# Patient Record
Sex: Male | Born: 1935 | Race: Black or African American | Hispanic: No | Marital: Married | Smoking: Former smoker
Health system: Southern US, Community
[De-identification: ages and names within clinical notes are randomized; demographics above are authoritative.]

## PROBLEM LIST (undated history)

## (undated) DIAGNOSIS — E079 Disorder of thyroid, unspecified: Secondary | ICD-10-CM

## (undated) DIAGNOSIS — E785 Hyperlipidemia, unspecified: Secondary | ICD-10-CM

## (undated) DIAGNOSIS — I2510 Atherosclerotic heart disease of native coronary artery without angina pectoris: Secondary | ICD-10-CM

## (undated) DIAGNOSIS — N186 End stage renal disease: Secondary | ICD-10-CM

## (undated) DIAGNOSIS — H5440 Blindness, one eye, unspecified eye: Secondary | ICD-10-CM

## (undated) DIAGNOSIS — I509 Heart failure, unspecified: Secondary | ICD-10-CM

## (undated) DIAGNOSIS — E1121 Type 2 diabetes mellitus with diabetic nephropathy: Secondary | ICD-10-CM

## (undated) DIAGNOSIS — I219 Acute myocardial infarction, unspecified: Secondary | ICD-10-CM

## (undated) DIAGNOSIS — I10 Essential (primary) hypertension: Secondary | ICD-10-CM

## (undated) HISTORY — DX: Blindness, one eye, unspecified eye: H54.40

## (undated) HISTORY — DX: End stage renal disease: N18.6

## (undated) HISTORY — PX: PACEMAKER INSERTION: SHX728

## (undated) HISTORY — DX: Heart failure, unspecified: I50.9

## (undated) HISTORY — DX: Essential (primary) hypertension: I10

## (undated) HISTORY — DX: Acute myocardial infarction, unspecified: I21.9

## (undated) HISTORY — PX: CARDIAC CATHETERIZATION: SHX172

## (undated) HISTORY — PX: PORTACATH PLACEMENT: SHX2246

## (undated) HISTORY — DX: Type 2 diabetes mellitus with diabetic nephropathy: E11.21

## (undated) HISTORY — DX: Atherosclerotic heart disease of native coronary artery without angina pectoris: I25.10

## (undated) HISTORY — DX: Disorder of thyroid, unspecified: E07.9

## (undated) HISTORY — DX: Hyperlipidemia, unspecified: E78.5

---

## 2007-08-30 ENCOUNTER — Ambulatory Visit: Payer: Self-pay | Admitting: Nephrology

## 2012-08-06 ENCOUNTER — Ambulatory Visit: Payer: Self-pay | Admitting: Vascular Surgery

## 2012-08-06 LAB — POTASSIUM: Potassium: 5 mmol/L (ref 3.5–5.1)

## 2012-08-20 ENCOUNTER — Ambulatory Visit: Payer: Self-pay | Admitting: Vascular Surgery

## 2012-08-20 LAB — POTASSIUM: Potassium: 5.3 mmol/L — ABNORMAL HIGH (ref 3.5–5.1)

## 2012-08-20 LAB — GLUCOSE, RANDOM: Glucose: 75 mg/dL (ref 65–99)

## 2012-11-15 ENCOUNTER — Ambulatory Visit: Payer: Self-pay | Admitting: Vascular Surgery

## 2013-01-07 ENCOUNTER — Encounter: Payer: Self-pay | Admitting: Cardiovascular Disease

## 2013-01-07 ENCOUNTER — Ambulatory Visit (INDEPENDENT_AMBULATORY_CARE_PROVIDER_SITE_OTHER): Payer: Medicare Other | Admitting: Cardiovascular Disease

## 2013-01-07 VITALS — BP 142/82 | HR 67 | Ht 69.0 in | Wt 252.2 lb

## 2013-01-07 DIAGNOSIS — R079 Chest pain, unspecified: Secondary | ICD-10-CM

## 2013-01-07 DIAGNOSIS — Z01810 Encounter for preprocedural cardiovascular examination: Secondary | ICD-10-CM

## 2013-01-07 NOTE — Patient Instructions (Addendum)
Your physician has requested that you have a lexiscan myoview. For further information please visit [email or web address]. Please follow instruction sheet, as given.  Follow up as needed 

## 2013-01-07 NOTE — Progress Notes (Signed)
HPI  This is a 78 year old African American male who was referred by Dr. Wynelle Link for preoperative cardiovascular evaluation for possibly AV fistula under general anesthesia. The patient has extensive medical problems. He has end-stage renal disease on hemodialysis since 2010 related to prolonged history of diabetes (he reports having diabetes for 50 years). He also has no history of hypertension, hyperlipidemia and diabetic retinopathy causing blindness. He reports having myocardial infarction years ago in [place] but the details are not available. He reports having a heart catheterization done more than 3 years ago. He is not aware of any previous revascularization. There has been no recent cardiac evaluation. He is mostly wheelchair bound but can walk slowly with a walker. She reports no chest pain but does have exertional dyspnea. His functional capacity is poor.  No Known Allergies   No current outpatient prescriptions on file prior to visit.   No current facility-administered medications on file prior to visit.     Past Medical History  Diagnosis Date  . Hypertension   . Hyperlipidemia   . Renal failure   . MI (myocardial infarction)   . Thyroid disease     hypo  . 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 Diabetes mellitus without complication      Past Surgical History  Procedure Laterality Date  . Cardiac catheterization      [facility]  . Portacath placement       Family History  Problem Relation Age of Onset  . Hypertension Mother   . Hyperlipidemia Mother   . Heart disease Mother   . Heart attack Mother      History   Social History  . Marital Status: Married    Spouse Name: N/A    Number of Children: N/A  . Years of Education: N/A   Occupational History  . Not on file.   Social History Main Topics  . Smoking status: Former Smoker -- 1.00 packs/day for 25 years  . Smokeless tobacco: Not on file  . Alcohol Use: No  . Drug Use: No  . Sexually Active: Not on file     Other Topics Concern  . Not on file   Social History Narrative  . No narrative on file     ROS Constitutional: Negative for fever, chills, diaphoresis, activity change, appetite change and fatigue.  HENT: Negative for hearing loss, nosebleeds, congestion, sore throat, facial swelling, drooling, trouble swallowing, neck pain, voice change, sinus pressure and tinnitus.  Eyes: Negative for photophobia, pain, discharge and visual disturbance.  Respiratory: Negative for apnea, cough,  shortness of breath and wheezing.  Cardiovascular: Negative for chest pain, palpitations. Gastrointestinal: Negative for nausea, vomiting, abdominal pain, diarrhea, constipation, blood in stool and abdominal distention.  Genitourinary: Negative for dysuria. Skin: Negative for color change, pallor, rash and wound.  Neurological: Negative for dizziness, tremors, seizures, syncope, speech difficulty, weakness, light-headedness, numbness and headaches.  Psychiatric/Behavioral: Negative for suicidal ideas, hallucinations, behavioral problems and agitation. The patient is not nervous/anxious.     PHYSICAL EXAM   BP 142/82  Pulse 67  Ht 5\' 9"  (1.753 m)  Wt 252 lb 4 oz (114.42 kg)  BMI 37.23 kg/m2 Constitutional: He is oriented to person, place, and time. He appears well-developed and well-nourished. No distress.  HENT: No nasal discharge.  Head: Normocephalic and atraumatic.  Eyes: Pupils are equal and round. Right eye exhibits no discharge. Left eye exhibits no discharge.  Neck: Normal range of motion. Neck supple. No JVD present. No  thyromegaly present.  Cardiovascular: Normal rate, regular rhythm, normal heart sounds and. Exam reveals no gallop and no friction rub. No murmur heard.  Pulmonary/Chest: Effort normal and breath sounds normal. No stridor. No respiratory distress. He has no wheezes. He has no rales. He exhibits no tenderness.  Abdominal: Soft. Bowel sounds are normal. He exhibits no  distension. There is no tenderness. There is no rebound and no guarding.  Musculoskeletal: Normal range of motion. He exhibits no edema and no tenderness.  Neurological: He is alert and oriented to person, place, and time. Coordination normal.  Skin: Skin is warm and dry. No rash noted. He is not diaphoretic. No erythema. No pallor.  Psychiatric: He has a normal mood and affect. His behavior is normal. Judgment and thought content normal.       NWG:NFAOZ  Rhythm  Low voltage in precordial leads.   -Possible Old inferior infarct  -Poor R-wave progression -may be secondary to pulmonary disease   consider old anterior infarct.   ABNORMAL    ASSESSMENT AND PLAN

## 2013-01-07 NOTE — Assessment & Plan Note (Signed)
Mr. Runnion has extensive medical problems and multiple risk factors for coronary artery disease including prolonged history of diabetes. His functional capacity is poor (less than 4 METs). He reports exertional dyspnea without chest pain. His baseline ECG is abnormal with possible old inferior and anterior infarct with no recent cardiac evaluation. Due to that, I recommend a pharmacologic nuclear stress test for evaluation. He is not able to exercise on a treadmill as he is mostly wheelchair bound. Further recommendations to follow after his stress test.

## 2013-01-09 ENCOUNTER — Ambulatory Visit: Payer: Self-pay | Admitting: Cardiovascular Disease

## 2013-01-09 DIAGNOSIS — R079 Chest pain, unspecified: Secondary | ICD-10-CM

## 2013-01-13 ENCOUNTER — Telehealth: Payer: Self-pay

## 2013-01-13 NOTE — Telephone Encounter (Signed)
Any suggestions>?

## 2013-01-13 NOTE — Telephone Encounter (Signed)
Pt son, daryl called back to schedule cath.

## 2013-01-13 NOTE — Telephone Encounter (Signed)
Pt's son, daryl, ready to schedule LHC for pt. Needs Tues/Thurs since he has dialysis on M,W,F I will have to talk to Dr. Kirke Corin about best day to do this and call him back Understanding verb

## 2013-01-13 NOTE — Telephone Encounter (Signed)
Results given to pt's wife who asks that I discuss with her son who manages their schedules, etc Pt is at dialysis at this time Will await son to call me back

## 2013-01-13 NOTE — Telephone Encounter (Signed)
See below

## 2013-01-13 NOTE — Telephone Encounter (Signed)
Message copied by [facility], Nerea Bordenave E on Mon Jan 13, 2013 11:03 AM ------      Message from: Lorine Bears A      Created: Mon Jan 13, 2013 10:19 AM      Regarding: RE: stress test       Inform him that stress test was abnormal. I recommend scheduling a cardiac catheterization.             ----- Message -----         From: Marcelle Overlie, RN         Sent: 01/13/2013   8:23 AM           To: Iran Ouch, MD      Subject: stress test                                              FYI: Per Dr. Mariah Milling stress test was positive      Just let me know what I need to do and I will contact pt      Thanks!!       ------

## 2013-01-14 NOTE — Telephone Encounter (Signed)
Next week on Thursday at noon

## 2013-01-15 NOTE — Telephone Encounter (Signed)
He needs CBC, BMP and PT/INR.

## 2013-01-15 NOTE — Telephone Encounter (Signed)
Attempted to schedule cath x 3 at [facility] going to [facility] Will keep trying

## 2013-01-15 NOTE — Telephone Encounter (Signed)
Son informed of instructions re:LHC 5/1 Understanding verb

## 2013-01-15 NOTE — Telephone Encounter (Signed)
Will you please review labs from dialysis and let me know if you think he needs more labs and I can have them get these at next dialysis appt  Does he need pre cath renal protocol orders?  

## 2013-01-15 NOTE — Telephone Encounter (Signed)
Will you please review labs from dialysis and let me know if you think he needs more labs and I can have them get these at next dialysis appt  Does he need pre cath renal protocol orders?

## 2013-01-15 NOTE — Telephone Encounter (Signed)
Schedule LHC at [facility] 01/23/13

## 2013-01-16 ENCOUNTER — Other Ambulatory Visit: Payer: Self-pay

## 2013-01-16 DIAGNOSIS — I2510 Atherosclerotic heart disease of native coronary artery without angina pectoris: Secondary | ICD-10-CM

## 2013-01-16 NOTE — Telephone Encounter (Signed)
They gave me local # [phone]

## 2013-01-16 NOTE — Telephone Encounter (Signed)
Calling [facility] in [place] at [phone] to have them draw labs at next dialysis appt

## 2013-01-16 NOTE — Telephone Encounter (Signed)
Per Dialysis nurse, they only draw labs monthly and have already checked for this month We will bring him in for labs  Wife informed They will bring him 4/29 for labs

## 2013-01-21 ENCOUNTER — Ambulatory Visit (INDEPENDENT_AMBULATORY_CARE_PROVIDER_SITE_OTHER): Payer: Medicare Other

## 2013-01-21 DIAGNOSIS — I2510 Atherosclerotic heart disease of native coronary artery without angina pectoris: Secondary | ICD-10-CM

## 2013-01-23 ENCOUNTER — Ambulatory Visit: Payer: Self-pay | Admitting: Cardiovascular Disease

## 2013-01-23 ENCOUNTER — Encounter: Payer: Self-pay | Admitting: Cardiovascular Disease

## 2013-01-23 DIAGNOSIS — I2510 Atherosclerotic heart disease of native coronary artery without angina pectoris: Secondary | ICD-10-CM

## 2013-01-23 HISTORY — PX: CARDIAC CATHETERIZATION: SHX172

## 2013-01-27 ENCOUNTER — Encounter: Payer: Self-pay | Admitting: *Deleted

## 2013-01-28 LAB — CBC WITH DIFFERENTIAL
Basos: 0 % (ref 0–3)
Eos: 0 % (ref 0–5)
HCT: 32.2 % — ABNORMAL LOW (ref 37.5–51.0)
Hemoglobin: 10.9 g/dL — ABNORMAL LOW (ref 12.6–17.7)
Immature Granulocytes: 0 % (ref 0–2)
Lymphocytes Absolute: 1.5 10*3/uL (ref 0.7–3.1)
MCH: 30.7 pg (ref 26.6–33.0)
MCV: 91 fL (ref 79–97)
Monocytes Absolute: 0.4 10*3/uL (ref 0.1–0.9)
Monocytes: 8 % (ref 4–12)
RBC: 3.55 x10E6/uL — ABNORMAL LOW (ref 4.14–5.80)
WBC: 4.4 10*3/uL (ref 3.4–10.8)

## 2013-01-28 LAB — BASIC METABOLIC PANEL
BUN/Creatinine Ratio: 4 — ABNORMAL LOW (ref 10–22)
BUN: 36 mg/dL — ABNORMAL HIGH (ref 8–27)
CO2: 20 mmol/L (ref 19–28)
Chloride: 96 mmol/L — ABNORMAL LOW (ref 97–108)
Glucose: 96 mg/dL (ref 65–99)

## 2013-01-28 LAB — PROTIME-INR
INR: 1.1 (ref 0.8–1.2)
Prothrombin Time: 11.2 s (ref 9.1–12.0)

## 2013-01-30 ENCOUNTER — Telehealth: Payer: Self-pay

## 2013-01-30 NOTE — Telephone Encounter (Signed)
You can go ahead and send the clearance (moderate risk). Fax them the cath report as well.

## 2013-01-30 NOTE — Telephone Encounter (Signed)
Tanisha from [facility] called regarding pt cardica clearance. Pt needs surgery pending outcome of stress test. Pleas advise Fax [phone]/ phone [phone]

## 2013-01-30 NOTE — Telephone Encounter (Signed)
Will forward to Dr. Kirke Corin. Cath report pulled from 01/23/13 stating 2 vessel CAD with planned medical therapy.  Moderate risk for planned surgery. I didn't know if you wanted me to go ahead and send clearance for surgery or did you want to to see him for post cath follow up prior to me sending this. Appointment with you is on 02/14/13.

## 2013-01-31 NOTE — Telephone Encounter (Signed)
faxing

## 2013-02-11 ENCOUNTER — Encounter: Payer: Self-pay | Admitting: Physician Assistant

## 2013-02-11 ENCOUNTER — Ambulatory Visit (INDEPENDENT_AMBULATORY_CARE_PROVIDER_SITE_OTHER): Payer: Medicare Other | Admitting: Physician Assistant

## 2013-02-11 VITALS — BP 165/83 | HR 68 | Ht 69.0 in | Wt 255.8 lb

## 2013-02-11 DIAGNOSIS — I2510 Atherosclerotic heart disease of native coronary artery without angina pectoris: Secondary | ICD-10-CM | POA: Insufficient documentation

## 2013-02-11 DIAGNOSIS — I10 Essential (primary) hypertension: Secondary | ICD-10-CM | POA: Insufficient documentation

## 2013-02-11 DIAGNOSIS — R42 Dizziness and giddiness: Secondary | ICD-10-CM

## 2013-02-11 DIAGNOSIS — E785 Hyperlipidemia, unspecified: Secondary | ICD-10-CM

## 2013-02-11 DIAGNOSIS — Z01810 Encounter for preprocedural cardiovascular examination: Secondary | ICD-10-CM

## 2013-02-11 MED ORDER — NITROGLYCERIN 0.4 MG SL SUBL: 0.4000 mg | SUBLINGUAL_TABLET | SUBLINGUAL | Status: AC | PRN

## 2013-02-11 MED ORDER — ATORVASTATIN CALCIUM 80 MG PO TABS: 80.0000 mg | ORAL_TABLET | Freq: Every day | ORAL | Status: DC

## 2013-02-11 MED ORDER — CARVEDILOL 6.25 MG PO TABS: 6.2500 mg | ORAL_TABLET | Freq: Two times a day (BID) | ORAL | Status: DC

## 2013-02-11 NOTE — Assessment & Plan Note (Addendum)
The patient has diffuse diabetic CAD evidence of cardiac cath earlier this month. No targets for PCI. This is clinically stable. He denies any intercurrent progressive or new ischemic symptoms. No new ischemic signs on EKG today. Will plan to aggressively manage. Continue ASA/Plavix, ACEi, add carvedilol 6.25mg  PO BID (hallucinated on metoprolol in the past), replace lovastatin with atorvastatin 80 and add NTG SL PRN.

## 2013-02-11 NOTE — Patient Instructions (Addendum)
We will add carvedilol 6.25 mg by mouth twice a day. Please monitor your blood pressure with this medication.  We will place lovastatin with atorvastatin. Please take as prescribed. We will need to check labwork for your liver and cholesterol in 6 weeks with this medication.  Please follow-up with vascular/vein specialists as scheduled.   We will add nitroglycerin. Please take as needed for chest pain. If you have chest pain, place one tablet under your tongue and let it dissolve. If the pain continues after 5 minutes, take a second tablet and wait 5 minutes, if the pain persists take a third tablet and wait 5 minutes. If the pain continues call 911.   We will plan to see you in 3 months.

## 2013-02-11 NOTE — Assessment & Plan Note (Signed)
Lovastatin replaced with high-dose atorvastatin. Check LFTs and lipid panel in 6 weeks.

## 2013-02-11 NOTE — Assessment & Plan Note (Signed)
Moderate pre-op risk deemed by Dr. Deborha Payment. Will be available if recommendations for pre-op holding of ASA/Plavix requested.

## 2013-02-11 NOTE — Progress Notes (Signed)
Patient ID: [name], male   DOB: October 07, 1934, 78 y.o.   MRN: [id number]            Date:  02/11/2013   ID:  [name], DOB 1935-03-15, MRN [id number]  PCP:  No PCP Per Patient  Primary Cardiologist:  Judie Petit. Kirke Corin, MD  History of Present Illness: [name] is a 78 y.o. male with longstanding type 2 DM with diabetic nephrology, ESRD on HD, HTN, HLD, obesity and h/o prior MI (in [place]) who was referred for pre-op evaluation for upper extremity AV fistula 01/07/13.   He denied any new or progressive ischemic symptoms, however given his significant cardiac risk factors and history of prior MI, the decision was made to proceed with further ischemic evalutation in the form of diagnostic cardiac catheterization. This was performed on 01/23/13.   This revealed diffuse, heavily calcified CAD in a diabetic pattern without optimal targets for PCI- mild left main calcification, 30% oLAD, 40% pLAD, 50% mLAD, 30% dLAD, 40% mLCx, 90% OM1, 50% OM1, 50% pRI, 50% pRCA,50% mRCA, 99% dRCA stenoses; LVEF 50%. He tolerated the procedure well without complications. He was deemed moderate pre-operative risk. He presents today for follow-up.   He has felt well since cath. Groin site healed without residual tenderness, swelling or discharge. Denies chest pain, shortness of breath, dyspnea on exertion, PND, orthopnea or LE edema. He plans to follow-up with a vascular/vein specialist this Friday. Timing for AV fistula will be determined then.   EKG: NSR, 68 bpm, TWI/flattening III, aVF, no ST changes  Wt Readings from Last 3 Encounters:  02/11/13 255 lb 12 oz (116.007 kg)  01/07/13 252 lb 4 oz (114.42 kg)     Past Medical History  Diagnosis Date  . Hypertension   . Hyperlipidemia   . Renal failure   . MI (myocardial infarction)   . Thyroid disease     hypo  . 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 Diabetes mellitus without complication     Current Outpatient Prescriptions  Medication Sig Dispense Refill  . aspirin  81 MG tablet Take 81 mg by mouth daily.      . B Complex-C-Folic Acid (DIALYVITE 800 PO) Take by mouth daily.      . cinacalcet (SENSIPAR) 60 MG tablet Take 60 mg by mouth daily.      . clopidogrel (PLAVIX) 75 MG tablet Take 75 mg by mouth daily.      . hydrALAZINE (APRESOLINE) 50 MG tablet Take 50 mg by mouth 3 (three) times daily.      Marland Kitchen levothyroxine (SYNTHROID, LEVOTHROID) 100 MCG tablet Take 100 mcg by mouth daily before breakfast.      . lisinopril (PRINIVIL,ZESTRIL) 10 MG tablet Take 10 mg by mouth daily.      . sevelamer carbonate (RENVELA) 800 MG tablet Takes 4 tablets three times a day and 1 tablet twice daily depending on dialysis.      Marland Kitchen atorvastatin (LIPITOR) 80 MG tablet Take 1 tablet (80 mg total) by mouth daily.  90 tablet  3  . carvedilol (COREG) 6.25 MG tablet Take 1 tablet (6.25 mg total) by mouth 2 (two) times daily.  60 tablet  3  . nitroGLYCERIN (NITROSTAT) 0.4 MG SL tablet Place 1 tablet (0.4 mg total) under the tongue every 5 (five) minutes as needed for chest pain.  25 tablet  3   No current facility-administered medications for this visit.    Allergies:   No Known Allergies  Social History:  The patient  reports that he has quit smoking. He does not have any smokeless tobacco history on file. He reports that he does not drink alcohol or use illicit drugs.   ROS:  Please see the history of present illness.   All other systems reviewed and negative.   PHYSICAL EXAM: VS:  BP 165/83  Pulse 68  Ht 5\' 9"  (1.753 m)  Wt 255 lb 12 oz (116.007 kg)  BMI 37.75 kg/m2 Obese African-American male in a wheelchair in NAD HEENT: normal, poor dentition Neck: no JVD Cardiac:  normal S1, S2; RRR; no murmur Lungs:  clear to auscultation bilaterally, no wheezing, rhonchi or rales Abd: soft, nontender, no hepatomegaly Ext: no edema, no erythema, tenderness or bruit to R groin site Skin: warm and dry Neuro:  CNs 2-12 intact, no focal abnormalities noted

## 2013-02-11 NOTE — Assessment & Plan Note (Signed)
165/83 in the office today. Will add carvedilol to antihypertensive regimen.

## 2013-02-13 ENCOUNTER — Ambulatory Visit: Payer: Self-pay | Admitting: Vascular Surgery

## 2013-02-13 LAB — BASIC METABOLIC PANEL
Co2: 27 mmol/L (ref 21–32)
EGFR (Non-African Amer.): 6 — ABNORMAL LOW
Osmolality: 284 (ref 275–301)
Sodium: 138 mmol/L (ref 136–145)

## 2013-02-14 ENCOUNTER — Ambulatory Visit: Payer: Medicare Other | Admitting: Cardiovascular Disease

## 2013-02-20 LAB — CBC
HCT: 30.7 % — ABNORMAL LOW (ref 40.0–52.0)
HGB: 10.4 g/dL — ABNORMAL LOW (ref 13.0–18.0)
MCH: 32 pg (ref 26.0–34.0)
MCHC: 34 g/dL (ref 32.0–36.0)
Platelet: 206 10*3/uL (ref 150–440)
WBC: 4.9 10*3/uL (ref 3.8–10.6)

## 2013-02-21 ENCOUNTER — Ambulatory Visit: Payer: Self-pay | Admitting: Vascular Surgery

## 2013-03-26 ENCOUNTER — Telehealth: Payer: Self-pay

## 2013-03-26 NOTE — Telephone Encounter (Signed)
I called pt to remind him he is due for L/L  I spoke with wife who says pt at dialysis now but has appt with [facility] 7/10 Has issues with transportation so I told her I would call [facility] to see if they can check it same day

## 2013-04-30 ENCOUNTER — Ambulatory Visit: Payer: Self-pay | Admitting: Physician Assistant

## 2013-06-18 ENCOUNTER — Emergency Department: Payer: Self-pay | Admitting: Emergency Medicine

## 2013-06-18 LAB — URINALYSIS, COMPLETE
Bacteria: NONE SEEN
Ketone: NEGATIVE
Nitrite: NEGATIVE
Protein: 100
Specific Gravity: 1.012 (ref 1.003–1.030)
Squamous Epithelial: <1
WBC UR: 7 /HPF (ref 0–5)

## 2013-06-18 LAB — COMPREHENSIVE METABOLIC PANEL
Alkaline Phosphatase: 75 U/L (ref 50–136)
BUN: 37 mg/dL — ABNORMAL HIGH (ref 7–18)
Bilirubin,Total: 0.4 mg/dL (ref 0.2–1.0)
Co2: 22 mmol/L (ref 21–32)
Creatinine: 7.18 mg/dL — ABNORMAL HIGH (ref 0.60–1.30)
Glucose: 92 mg/dL (ref 65–99)
Potassium: 4.8 mmol/L (ref 3.5–5.1)
Sodium: 134 mmol/L — ABNORMAL LOW (ref 136–145)
Total Protein: 7.9 g/dL (ref 6.4–8.2)

## 2013-06-18 LAB — PROTIME-INR
INR: 1
Prothrombin Time: 13.8 secs (ref 11.5–14.7)

## 2013-06-18 LAB — CK TOTAL AND CKMB (NOT AT ARMC)
CK, Total: 412 U/L — ABNORMAL HIGH (ref 35–232)
CK-MB: 5.2 ng/mL — ABNORMAL HIGH (ref 0.5–3.6)

## 2013-06-18 LAB — CBC
MCH: 29.9 pg (ref 26.0–34.0)
MCHC: 33.6 g/dL (ref 32.0–36.0)
RBC: 4.13 10*6/uL — ABNORMAL LOW (ref 4.40–5.90)

## 2013-06-18 LAB — APTT: Activated PTT: 28.8 secs (ref 23.6–35.9)

## 2013-06-18 LAB — TROPONIN I
Troponin-I: <0.02 ng/mL
Troponin-I: <0.02 ng/mL

## 2013-09-02 ENCOUNTER — Ambulatory Visit: Payer: Self-pay | Admitting: Vascular Surgery

## 2013-09-02 LAB — POTASSIUM: Potassium: 5.2 mmol/L — ABNORMAL HIGH (ref 3.5–5.1)

## 2013-09-03 ENCOUNTER — Inpatient Hospital Stay: Payer: Self-pay | Admitting: Internal Medicine

## 2013-09-03 DIAGNOSIS — I369 Nonrheumatic tricuspid valve disorder, unspecified: Secondary | ICD-10-CM

## 2013-09-03 DIAGNOSIS — I499 Cardiac arrhythmia, unspecified: Secondary | ICD-10-CM

## 2013-09-03 DIAGNOSIS — I959 Hypotension, unspecified: Secondary | ICD-10-CM

## 2013-09-03 DIAGNOSIS — I214 Non-ST elevation (NSTEMI) myocardial infarction: Secondary | ICD-10-CM

## 2013-09-03 LAB — COMPREHENSIVE METABOLIC PANEL
Albumin: 3.2 g/dL — ABNORMAL LOW (ref 3.4–5.0)
Anion Gap: 14 (ref 7–16)
BUN: 58 mg/dL — ABNORMAL HIGH (ref 7–18)
Calcium, Total: 9.3 mg/dL (ref 8.5–10.1)
Chloride: 101 mmol/L (ref 98–107)
Creatinine: 11.04 mg/dL — ABNORMAL HIGH (ref 0.60–1.30)
Glucose: 151 mg/dL — ABNORMAL HIGH (ref 65–99)
Osmolality: 287 (ref 275–301)
Potassium: 6.2 mmol/L — ABNORMAL HIGH (ref 3.5–5.1)
SGOT(AST): 89 U/L — ABNORMAL HIGH (ref 15–37)
SGPT (ALT): 28 U/L (ref 12–78)
Sodium: 134 mmol/L — ABNORMAL LOW (ref 136–145)

## 2013-09-03 LAB — BASIC METABOLIC PANEL
Co2: 19 mmol/L — ABNORMAL LOW (ref 21–32)
Creatinine: 11.46 mg/dL — ABNORMAL HIGH (ref 0.60–1.30)
EGFR (African American): 4 — ABNORMAL LOW
Glucose: 171 mg/dL — ABNORMAL HIGH (ref 65–99)
Osmolality: 288 (ref 275–301)

## 2013-09-03 LAB — CBC WITH DIFFERENTIAL/PLATELET
Basophil #: 0.1 10*3/uL (ref 0.0–0.1)
Basophil %: 0.4 %
Eosinophil %: 0 %
HCT: 35.8 % — ABNORMAL LOW (ref 40.0–52.0)
Lymphocyte #: 1.1 10*3/uL (ref 1.0–3.6)
Lymphocyte %: 7 %
MCHC: 32.3 g/dL (ref 32.0–36.0)
MCV: 94 fL (ref 80–100)
Monocyte %: 7.5 %
Neutrophil #: 13.5 10*3/uL — ABNORMAL HIGH (ref 1.4–6.5)
Neutrophil %: 85.1 %
RBC: 3.81 10*6/uL — ABNORMAL LOW (ref 4.40–5.90)
RDW: 17 % — ABNORMAL HIGH (ref 11.5–14.5)

## 2013-09-03 LAB — APTT: Activated PTT: 131.7 secs — ABNORMAL HIGH (ref 23.6–35.9)

## 2013-09-03 LAB — PROTIME-INR: INR: 1.2

## 2013-09-03 LAB — CBC
HGB: 11.8 g/dL — ABNORMAL LOW (ref 13.0–18.0)
MCH: 30.4 pg (ref 26.0–34.0)
MCHC: 32.7 g/dL (ref 32.0–36.0)
MCV: 93 fL (ref 80–100)
RBC: 3.88 10*6/uL — ABNORMAL LOW (ref 4.40–5.90)
RDW: 16.9 % — ABNORMAL HIGH (ref 11.5–14.5)

## 2013-09-03 LAB — CK TOTAL AND CKMB (NOT AT ARMC): CK-MB: 12.8 ng/mL — ABNORMAL HIGH (ref 0.5–3.6)

## 2013-09-03 LAB — TROPONIN I: Troponin-I: 12 ng/mL — ABNORMAL HIGH

## 2013-09-04 LAB — BASIC METABOLIC PANEL
Anion Gap: 12 (ref 7–16)
BUN: 67 mg/dL — ABNORMAL HIGH (ref 7–18)
Calcium, Total: 9.6 mg/dL (ref 8.5–10.1)
Chloride: 97 mmol/L — ABNORMAL LOW (ref 98–107)
Co2: 23 mmol/L (ref 21–32)
EGFR (African American): 4 — ABNORMAL LOW
Glucose: 160 mg/dL — ABNORMAL HIGH (ref 65–99)
Osmolality: 287 (ref 275–301)
Sodium: 132 mmol/L — ABNORMAL LOW (ref 136–145)

## 2013-09-04 LAB — CBC WITH DIFFERENTIAL/PLATELET
Basophil #: 0.1 10*3/uL (ref 0.0–0.1)
Eosinophil #: 0 10*3/uL (ref 0.0–0.7)
Eosinophil %: 0 %
HCT: 33.4 % — ABNORMAL LOW (ref 40.0–52.0)
MCHC: 31.2 g/dL — ABNORMAL LOW (ref 32.0–36.0)
MCV: 93 fL (ref 80–100)
Platelet: 182 10*3/uL (ref 150–440)
RDW: 17 % — ABNORMAL HIGH (ref 11.5–14.5)
WBC: 13.4 10*3/uL — ABNORMAL HIGH (ref 3.8–10.6)

## 2013-09-04 LAB — APTT
Activated PTT: 134.6 secs — ABNORMAL HIGH (ref 23.6–35.9)
Activated PTT: 32.1 secs (ref 23.6–35.9)

## 2013-09-04 LAB — PHOSPHORUS: Phosphorus: 8.4 mg/dL — ABNORMAL HIGH (ref 2.5–4.9)

## 2013-09-05 LAB — PHOSPHORUS: Phosphorus: 6.5 mg/dL — ABNORMAL HIGH (ref 2.5–4.9)

## 2013-09-05 LAB — CBC WITH DIFFERENTIAL/PLATELET
Basophil %: 0.2 %
Eosinophil #: 0 10*3/uL (ref 0.0–0.7)
Eosinophil %: 0.1 %
Lymphocyte #: 0.8 10*3/uL — ABNORMAL LOW (ref 1.0–3.6)
Lymphocyte %: 11.4 %
MCH: 30.8 pg (ref 26.0–34.0)
MCHC: 33.4 g/dL (ref 32.0–36.0)
MCV: 92 fL (ref 80–100)
Monocyte #: 0.5 x10 3/mm (ref 0.2–1.0)
Monocyte %: 7.4 %
Neutrophil #: 5.7 10*3/uL (ref 1.4–6.5)
Neutrophil %: 80.9 %
RDW: 16.7 % — ABNORMAL HIGH (ref 11.5–14.5)
WBC: 7.1 10*3/uL (ref 3.8–10.6)

## 2013-09-05 LAB — BASIC METABOLIC PANEL
Chloride: 95 mmol/L — ABNORMAL LOW (ref 98–107)
Co2: 24 mmol/L (ref 21–32)
EGFR (African American): 6 — ABNORMAL LOW
EGFR (Non-African Amer.): 5 — ABNORMAL LOW
Osmolality: 278 (ref 275–301)
Potassium: 4.2 mmol/L (ref 3.5–5.1)

## 2013-09-06 LAB — BASIC METABOLIC PANEL
Anion Gap: 5 — ABNORMAL LOW (ref 7–16)
Calcium, Total: 8.7 mg/dL (ref 8.5–10.1)
Chloride: 92 mmol/L — ABNORMAL LOW (ref 98–107)
Co2: 35 mmol/L — ABNORMAL HIGH (ref 21–32)
Creatinine: 6.02 mg/dL — ABNORMAL HIGH (ref 0.60–1.30)
EGFR (African American): 10 — ABNORMAL LOW
Glucose: 147 mg/dL — ABNORMAL HIGH (ref 65–99)
Osmolality: 272 (ref 275–301)
Sodium: 132 mmol/L — ABNORMAL LOW (ref 136–145)

## 2013-09-06 LAB — CBC WITH DIFFERENTIAL/PLATELET
Basophil %: 0.5 %
Eosinophil #: 0 10*3/uL (ref 0.0–0.7)
HGB: 10.2 g/dL — ABNORMAL LOW (ref 13.0–18.0)
Lymphocyte #: 1 10*3/uL (ref 1.0–3.6)
MCH: 30.6 pg (ref 26.0–34.0)
MCHC: 33.1 g/dL (ref 32.0–36.0)
Monocyte #: 0.7 x10 3/mm (ref 0.2–1.0)
Monocyte %: 12.7 %
Neutrophil #: 3.9 10*3/uL (ref 1.4–6.5)
Neutrophil %: 69 %
WBC: 5.7 10*3/uL (ref 3.8–10.6)

## 2013-09-07 LAB — BASIC METABOLIC PANEL
Anion Gap: 7 (ref 7–16)
BUN: 39 mg/dL — ABNORMAL HIGH (ref 7–18)
Calcium, Total: 9 mg/dL (ref 8.5–10.1)
Chloride: 91 mmol/L — ABNORMAL LOW (ref 98–107)
Creatinine: 7.63 mg/dL — ABNORMAL HIGH (ref 0.60–1.30)
Potassium: 4.2 mmol/L (ref 3.5–5.1)
Sodium: 130 mmol/L — ABNORMAL LOW (ref 136–145)

## 2013-09-08 ENCOUNTER — Ambulatory Visit: Payer: Self-pay | Admitting: Internal Medicine

## 2013-09-08 LAB — BASIC METABOLIC PANEL
BUN: 56 mg/dL — ABNORMAL HIGH (ref 7–18)
Chloride: 90 mmol/L — ABNORMAL LOW (ref 98–107)
EGFR (African American): 6 — ABNORMAL LOW
EGFR (Non-African Amer.): 5 — ABNORMAL LOW
Glucose: 179 mg/dL — ABNORMAL HIGH (ref 65–99)
Osmolality: 284 (ref 275–301)
Potassium: 4.5 mmol/L (ref 3.5–5.1)

## 2013-09-08 LAB — VANCOMYCIN, TROUGH: Vancomycin, Trough: 23 ug/mL (ref 10–20)

## 2013-09-08 LAB — CBC WITH DIFFERENTIAL/PLATELET
Basophil #: 0 10*3/uL (ref 0.0–0.1)
Eosinophil %: 0 %
MCHC: 32.2 g/dL (ref 32.0–36.0)
MCV: 93 fL (ref 80–100)
Monocyte %: 8.2 %
Neutrophil #: 6.4 10*3/uL (ref 1.4–6.5)
Neutrophil %: 79.6 %
Platelet: 239 10*3/uL (ref 150–440)
RDW: 16.3 % — ABNORMAL HIGH (ref 11.5–14.5)
WBC: 8 10*3/uL (ref 3.8–10.6)

## 2013-09-08 LAB — CULTURE, BLOOD (SINGLE)

## 2013-09-10 LAB — PHOSPHORUS: Phosphorus: 6.4 mg/dL — ABNORMAL HIGH (ref 2.5–4.9)

## 2013-09-11 DIAGNOSIS — I959 Hypotension, unspecified: Secondary | ICD-10-CM

## 2013-09-11 DIAGNOSIS — I498 Other specified cardiac arrhythmias: Secondary | ICD-10-CM

## 2013-09-11 DIAGNOSIS — I214 Non-ST elevation (NSTEMI) myocardial infarction: Secondary | ICD-10-CM

## 2013-09-12 LAB — CBC WITH DIFFERENTIAL/PLATELET
Basophil #: 0 10*3/uL (ref 0.0–0.1)
Basophil %: 0.3 %
Eosinophil #: 0 10*3/uL (ref 0.0–0.7)
Eosinophil %: 0.1 %
HCT: 31.6 % — ABNORMAL LOW (ref 40.0–52.0)
HGB: 10.1 g/dL — ABNORMAL LOW (ref 13.0–18.0)
Lymphocyte #: 1.5 10*3/uL (ref 1.0–3.6)
Lymphocyte %: 17.5 %
MCH: 29.8 pg (ref 26.0–34.0)
MCHC: 31.8 g/dL — ABNORMAL LOW (ref 32.0–36.0)
MCV: 94 fL (ref 80–100)
Monocyte #: 0.6 x10 3/mm (ref 0.2–1.0)
Monocyte %: 7.3 %
Neutrophil #: 6.5 10*3/uL (ref 1.4–6.5)
Neutrophil %: 74.8 %
Platelet: 333 10*3/uL (ref 150–440)
RBC: 3.38 10*6/uL — ABNORMAL LOW (ref 4.40–5.90)
RDW: 16 % — ABNORMAL HIGH (ref 11.5–14.5)
WBC: 8.6 10*3/uL (ref 3.8–10.6)

## 2013-09-12 LAB — BASIC METABOLIC PANEL
Anion Gap: 6 — ABNORMAL LOW (ref 7–16)
BUN: 52 mg/dL — ABNORMAL HIGH (ref 7–18)
Calcium, Total: 7.9 mg/dL — ABNORMAL LOW (ref 8.5–10.1)
Chloride: 94 mmol/L — ABNORMAL LOW (ref 98–107)
Co2: 34 mmol/L — ABNORMAL HIGH (ref 21–32)
Creatinine: 8.73 mg/dL — ABNORMAL HIGH (ref 0.60–1.30)
EGFR (African American): 6 — ABNORMAL LOW
EGFR (Non-African Amer.): 5 — ABNORMAL LOW
Glucose: 169 mg/dL — ABNORMAL HIGH (ref 65–99)
Osmolality: 286 (ref 275–301)
Potassium: 3.8 mmol/L (ref 3.5–5.1)
Sodium: 134 mmol/L — ABNORMAL LOW (ref 136–145)

## 2013-09-12 LAB — APTT: Activated PTT: 41.2 secs — ABNORMAL HIGH (ref 23.6–35.9)

## 2013-09-12 LAB — PROTIME-INR
INR: 1.3
Prothrombin Time: 16.5 secs — ABNORMAL HIGH (ref 11.5–14.7)

## 2013-09-12 LAB — PHOSPHORUS: Phosphorus: 4.7 mg/dL (ref 2.5–4.9)

## 2013-09-14 LAB — BASIC METABOLIC PANEL
Calcium, Total: 7.7 mg/dL — ABNORMAL LOW (ref 8.5–10.1)
Chloride: 95 mmol/L — ABNORMAL LOW (ref 98–107)
Co2: 35 mmol/L — ABNORMAL HIGH (ref 21–32)
Creatinine: 7.96 mg/dL — ABNORMAL HIGH (ref 0.60–1.30)
EGFR (African American): 7 — ABNORMAL LOW
Osmolality: 284 (ref 275–301)
Potassium: 3.7 mmol/L (ref 3.5–5.1)

## 2013-09-14 LAB — CBC WITH DIFFERENTIAL/PLATELET
Basophil %: 1.1 %
Eosinophil %: 0 %
HCT: 30.6 % — ABNORMAL LOW (ref 40.0–52.0)
Lymphocyte #: 1.4 10*3/uL (ref 1.0–3.6)
Lymphocyte %: 14.4 %
MCH: 30 pg (ref 26.0–34.0)
MCHC: 31.7 g/dL — ABNORMAL LOW (ref 32.0–36.0)
MCV: 95 fL (ref 80–100)
Monocyte %: 7.7 %
Neutrophil #: 7.4 10*3/uL — ABNORMAL HIGH (ref 1.4–6.5)
Platelet: 323 10*3/uL (ref 150–440)
WBC: 9.6 10*3/uL (ref 3.8–10.6)

## 2013-09-15 LAB — RENAL FUNCTION PANEL
Albumin: 2.3 g/dL — ABNORMAL LOW (ref 3.4–5.0)
Anion Gap: 7 (ref 7–16)
BUN: 50 mg/dL — ABNORMAL HIGH (ref 7–18)
Calcium, Total: 7.7 mg/dL — ABNORMAL LOW (ref 8.5–10.1)
Creatinine: 9.53 mg/dL — ABNORMAL HIGH (ref 0.60–1.30)
EGFR (African American): 6 — ABNORMAL LOW
Osmolality: 286 (ref 275–301)
Phosphorus: 3.4 mg/dL (ref 2.5–4.9)
Potassium: 4.3 mmol/L (ref 3.5–5.1)

## 2013-09-15 LAB — BASIC METABOLIC PANEL
Anion Gap: 6 — ABNORMAL LOW (ref 7–16)
Calcium, Total: 7.6 mg/dL — ABNORMAL LOW (ref 8.5–10.1)
Co2: 34 mmol/L — ABNORMAL HIGH (ref 21–32)
EGFR (African American): 6 — ABNORMAL LOW
EGFR (Non-African Amer.): 5 — ABNORMAL LOW
Glucose: 111 mg/dL — ABNORMAL HIGH (ref 65–99)
Potassium: 4.1 mmol/L (ref 3.5–5.1)

## 2013-09-16 ENCOUNTER — Encounter: Payer: Self-pay | Admitting: Cardiovascular Disease

## 2013-09-16 ENCOUNTER — Other Ambulatory Visit: Payer: Self-pay

## 2013-09-16 DIAGNOSIS — Z01810 Encounter for preprocedural cardiovascular examination: Secondary | ICD-10-CM

## 2013-09-16 DIAGNOSIS — I369 Nonrheumatic tricuspid valve disorder, unspecified: Secondary | ICD-10-CM

## 2013-09-16 LAB — RENAL FUNCTION PANEL
Albumin: 2.3 g/dL — ABNORMAL LOW (ref 3.4–5.0)
Anion Gap: 6 — ABNORMAL LOW (ref 7–16)
Chloride: 99 mmol/L (ref 98–107)
Co2: 34 mmol/L — ABNORMAL HIGH (ref 21–32)
EGFR (Non-African Amer.): 6 — ABNORMAL LOW
Glucose: 137 mg/dL — ABNORMAL HIGH (ref 65–99)
Sodium: 139 mmol/L (ref 136–145)

## 2013-09-17 DIAGNOSIS — I214 Non-ST elevation (NSTEMI) myocardial infarction: Secondary | ICD-10-CM

## 2013-09-18 LAB — PLATELET COUNT: Platelet: 352 x10 3/mm 3

## 2013-09-19 LAB — RENAL FUNCTION PANEL
Albumin: 2 g/dL — ABNORMAL LOW (ref 3.4–5.0)
Anion Gap: 8 (ref 7–16)
BUN: 69 mg/dL — ABNORMAL HIGH (ref 7–18)
Co2: 31 mmol/L (ref 21–32)
Creatinine: 9.14 mg/dL — ABNORMAL HIGH (ref 0.60–1.30)
EGFR (African American): 6 — ABNORMAL LOW
Glucose: 156 mg/dL — ABNORMAL HIGH (ref 65–99)
Osmolality: 293 (ref 275–301)
Phosphorus: 2.6 mg/dL (ref 2.5–4.9)
Potassium: 4.3 mmol/L (ref 3.5–5.1)
Sodium: 135 mmol/L — ABNORMAL LOW (ref 136–145)

## 2013-09-25 ENCOUNTER — Ambulatory Visit: Payer: Self-pay | Admitting: Internal Medicine

## 2013-09-26 LAB — COMPREHENSIVE METABOLIC PANEL
ALK PHOS: 70 U/L
Albumin: 2.6 g/dL — ABNORMAL LOW (ref 3.4–5.0)
Anion Gap: 5 — ABNORMAL LOW (ref 7–16)
BILIRUBIN TOTAL: 0.3 mg/dL (ref 0.2–1.0)
BUN: 23 mg/dL — ABNORMAL HIGH (ref 7–18)
CALCIUM: 8.4 mg/dL — AB (ref 8.5–10.1)
CHLORIDE: 99 mmol/L (ref 98–107)
CREATININE: 4.7 mg/dL — AB (ref 0.60–1.30)
Co2: 27 mmol/L (ref 21–32)
EGFR (African American): 13 — ABNORMAL LOW
EGFR (Non-African Amer.): 11 — ABNORMAL LOW
GLUCOSE: 139 mg/dL — AB (ref 65–99)
Osmolality: 269 (ref 275–301)
Potassium: 3.7 mmol/L (ref 3.5–5.1)
SGOT(AST): 29 U/L (ref 15–37)
SGPT (ALT): 20 U/L (ref 12–78)
Sodium: 131 mmol/L — ABNORMAL LOW (ref 136–145)
TOTAL PROTEIN: 7.5 g/dL (ref 6.4–8.2)

## 2013-09-26 LAB — CBC WITH DIFFERENTIAL/PLATELET
Basophil #: 0.1 10*3/uL (ref 0.0–0.1)
Basophil %: 0.8 %
Eosinophil #: 0 10*3/uL (ref 0.0–0.7)
Eosinophil %: 0.1 %
HCT: 29.9 % — AB (ref 40.0–52.0)
HGB: 9.7 g/dL — ABNORMAL LOW (ref 13.0–18.0)
Lymphocyte #: 1.3 10*3/uL (ref 1.0–3.6)
Lymphocyte %: 14.5 %
MCH: 30.3 pg (ref 26.0–34.0)
MCHC: 32.4 g/dL (ref 32.0–36.0)
MCV: 94 fL (ref 80–100)
MONO ABS: 0.6 x10 3/mm (ref 0.2–1.0)
Monocyte %: 6.8 %
Neutrophil #: 6.9 10*3/uL — ABNORMAL HIGH (ref 1.4–6.5)
Neutrophil %: 77.8 %
PLATELETS: 334 10*3/uL (ref 150–440)
RBC: 3.19 10*6/uL — ABNORMAL LOW (ref 4.40–5.90)
RDW: 16.2 % — ABNORMAL HIGH (ref 11.5–14.5)
WBC: 8.9 10*3/uL (ref 3.8–10.6)

## 2013-09-26 LAB — PROTIME-INR
INR: 1.3
PROTHROMBIN TIME: 15.6 s — AB (ref 11.5–14.7)

## 2013-09-26 LAB — TROPONIN I: Troponin-I: 0.56 ng/mL — ABNORMAL HIGH

## 2013-09-26 LAB — CK-MB: CK-MB: 2.8 ng/mL (ref 0.5–3.6)

## 2013-09-26 LAB — PHOSPHORUS: Phosphorus: 2.2 mg/dL — ABNORMAL LOW (ref 2.5–4.9)

## 2013-09-26 LAB — MAGNESIUM: MAGNESIUM: 2 mg/dL

## 2013-09-27 ENCOUNTER — Observation Stay: Payer: Self-pay | Admitting: Family Medicine

## 2013-09-27 DIAGNOSIS — F0390 Unspecified dementia without behavioral disturbance: Secondary | ICD-10-CM

## 2013-09-27 DIAGNOSIS — I959 Hypotension, unspecified: Secondary | ICD-10-CM

## 2013-09-27 DIAGNOSIS — I2510 Atherosclerotic heart disease of native coronary artery without angina pectoris: Secondary | ICD-10-CM

## 2013-09-27 DIAGNOSIS — N186 End stage renal disease: Secondary | ICD-10-CM

## 2013-09-27 DIAGNOSIS — R9431 Abnormal electrocardiogram [ECG] [EKG]: Secondary | ICD-10-CM

## 2013-09-27 LAB — CK-MB
CK-MB: 2.6 ng/mL (ref 0.5–3.6)
CK-MB: 2.5 ng/mL (ref 0.5–3.6)

## 2013-09-27 LAB — TROPONIN I
Troponin-I: 0.55 ng/mL — ABNORMAL HIGH
Troponin-I: 0.51 ng/mL — ABNORMAL HIGH

## 2013-09-27 LAB — GLUCOSE, RANDOM: Glucose: 94 mg/dL (ref 65–99)

## 2013-10-01 LAB — CULTURE, BLOOD (SINGLE)

## 2013-10-02 ENCOUNTER — Ambulatory Visit (INDEPENDENT_AMBULATORY_CARE_PROVIDER_SITE_OTHER): Payer: Medicare Other | Admitting: Cardiovascular Disease

## 2013-10-02 ENCOUNTER — Encounter: Payer: Self-pay | Admitting: Cardiovascular Disease

## 2013-10-02 VITALS — BP 110/72 | HR 70 | Ht 69.0 in | Wt 255.8 lb

## 2013-10-02 DIAGNOSIS — Z950 Presence of cardiac pacemaker: Secondary | ICD-10-CM

## 2013-10-02 DIAGNOSIS — I5042 Chronic combined systolic (congestive) and diastolic (congestive) heart failure: Secondary | ICD-10-CM

## 2013-10-02 DIAGNOSIS — I10 Essential (primary) hypertension: Secondary | ICD-10-CM

## 2013-10-02 DIAGNOSIS — I2510 Atherosclerotic heart disease of native coronary artery without angina pectoris: Secondary | ICD-10-CM

## 2013-10-02 NOTE — Patient Instructions (Signed)
Your physician recommends that you schedule a follow-up appointment in:  3 months with Dr. Kirke CorinArida and Dr. Zipporah PlantsKlein    Paula from our device clinic will come to see you at rehab this weekend to change your dressing and check your device.

## 2013-10-02 NOTE — Progress Notes (Signed)
HPI  This is a 79 year old African American male who is here today for a followup visit after recent hospitalization at [facility]. He has multiple chronic medical conditions that include longstanding type 2 DM with diabetic nephrology, ESRD on HD, HTN, HLD, obesity and h/o prior [place] (in [place]) and known coronary artery disease.  Due to significant exertional dyspnea, he underwent cardiac catheterization in May of 2014 which showed diffuse, heavily calcified CAD in a diabetic pattern without optimal targets for PCI- mild left main calcification, 30% oLAD, 40% pLAD, 50% mLAD, 30% dLAD, 40% mLCx, 90% OM1, 50% OM1, 50% pRI, 50% pRCA,50% mRCA, 99% dRCA stenoses; LVEF 50%.  He was treated medically. He presented recently to [facility] with hypotension and suspected sepsis. He ruled in for non-ST elevation myocardial infarction. He had a prolonged hospitalization due to persistent hypotension and bradycardia. Repeat echocardiogram showed a drop in his ejection fraction to 35-40%. A permanent pacemaker was placed by Dr. Juel BurrowMasoud. He was placed on Midodrin for persistent hypotension. He has been extremely deconditioned. He is currently staying at [facility]. He has been mostly wheelchair bound. He denies chest pain.   Allergies  Allergen Reactions  . Metoprolol     Hallucinations     Current Outpatient Prescriptions on File Prior to Visit  Medication Sig Dispense Refill  . aspirin 81 MG tablet Take 81 mg by mouth daily.      Marland Kitchen. atorvastatin (LIPITOR) 80 MG tablet Take 1 tablet (80 mg total) by mouth daily.  90 tablet  3  . B Complex-C-Folic Acid (DIALYVITE 800 PO) Take by mouth daily.      . carvedilol (COREG) 6.25 MG tablet Take 1 tablet (6.25 mg total) by mouth 2 (two) times daily.  60 tablet  3  . cinacalcet (SENSIPAR) 60 MG tablet Take 60 mg by mouth daily.      . clopidogrel (PLAVIX) 75 MG tablet Take 75 mg by mouth daily.      . hydrALAZINE (APRESOLINE) 50 MG tablet Take 50 mg by mouth 3  (three) times daily.      Marland Kitchen. levothyroxine (SYNTHROID, LEVOTHROID) 100 MCG tablet Take 100 mcg by mouth daily before breakfast.      . lisinopril (PRINIVIL,ZESTRIL) 10 MG tablet Take 10 mg by mouth daily.      . nitroGLYCERIN (NITROSTAT) 0.4 MG SL tablet Place 1 tablet (0.4 mg total) under the tongue every 5 (five) minutes as needed for chest pain.  25 tablet  3  . sevelamer carbonate (RENVELA) 800 MG tablet Takes 4 tablets three times a day and 1 tablet twice daily depending on dialysis.       No current facility-administered medications on file prior to visit.     Past Medical History  Diagnosis Date  . Hypertension   . Hyperlipidemia   . MI (myocardial infarction)   . Thyroid disease     hypo  . 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 ESRD (end stage renal disease)     on HD  . Type 2 diabetes mellitus with diabetic nephropathy   . Coronary artery disease     Diffuse diabetic CAD on 01/23/13 cath; medically managed  . CHF (congestive heart failure)      Past Surgical History  Procedure Laterality Date  . Portacath placement    . Cardiac catheterization      [facility]  . Cardiac catheterization  01/23/13    [facility]- mild left main calcification, 30% oLAD, 40% pLAD, 50%  mLAD, 30% dLAD, 40% mLCx, 90% OM1, 50% OM1, 50% pRI, 50% pRCA,50% mRCA, 99% dRCA stenoses; LVEF 50%.  . Pacemaker insertion       Family History  Problem Relation Age of Onset  . Hypertension Mother   . Hyperlipidemia Mother   . Heart disease Mother   . Heart attack Mother      History   Social History  . Marital Status: Married    Spouse Name: N/A    Number of Children: N/A  . Years of Education: N/A   Occupational History  . Not on file.   Social History Main Topics  . Smoking status: Former Smoker -- 1.00 packs/day for 25 years  . Smokeless tobacco: Not on file  . Alcohol Use: No  . Drug Use: No  . Sexual Activity: Not on file   Other Topics Concern  . Not on file   Social History Narrative  . No  narrative on file      PHYSICAL EXAM   BP 110/72  Pulse 70  Ht 5\' 9"  (1.753 m)  Wt 255 lb 12 oz (116.007 kg)  BMI 37.75 kg/m2 Constitutional: He is oriented to person, place, and time. He appears well-developed and well-nourished. No distress.  HENT: No nasal discharge.  Head: Normocephalic and atraumatic.  Eyes: Pupils are equal and round. Right eye exhibits no discharge. Left eye exhibits no discharge.  Neck: Normal range of motion. Neck supple. No JVD present. No thyromegaly present.  Cardiovascular: Normal rate, regular rhythm, normal heart sounds and. Exam reveals no gallop and no friction rub. No murmur heard.  Pulmonary/Chest: Effort normal and breath sounds normal. No stridor. No respiratory distress. He has no wheezes. He has no rales. He exhibits no tenderness.  Abdominal: Soft. Bowel sounds are normal. He exhibits no distension. There is no tenderness. There is no rebound and no guarding.  Musculoskeletal: Normal range of motion. He exhibits no edema and no tenderness.  Neurological: He is alert and oriented to person, place, and time. Coordination normal.  Skin: Skin is warm and dry. No rash noted. He is not diaphoretic. No erythema. No pallor.  Psychiatric: He has a normal mood and affect. His behavior is normal. Judgment and thought content normal.       EKG: Sinus rhythm with first-degree AV block and  incomplete right bundle branch block with inferolateral ST changes.   ASSESSMENT AND PLAN

## 2013-10-03 ENCOUNTER — Encounter: Payer: Self-pay | Admitting: Cardiovascular Disease

## 2013-10-03 DIAGNOSIS — Z950 Presence of cardiac pacemaker: Secondary | ICD-10-CM | POA: Insufficient documentation

## 2013-10-03 NOTE — Assessment & Plan Note (Signed)
I am  referring him to the device clinic for a wound check and to establish care.

## 2013-10-03 NOTE — Assessment & Plan Note (Signed)
He is off all blood pressure medications due to persistent hypotension. Continue treatment with Midodrin.

## 2013-10-03 NOTE — Assessment & Plan Note (Signed)
The patient had a recent non-ST elevation myocardial infarction in the setting of sepsis and hypotension. He does have significant three-vessel coronary artery disease with heavily calcified vessels. Due to his poor functional capacity and multiple comorbidities, he is not a candidate for CABG. Treatment options are limited. I recommend continuing medical therapy.

## 2013-10-06 ENCOUNTER — Ambulatory Visit (INDEPENDENT_AMBULATORY_CARE_PROVIDER_SITE_OTHER): Payer: Medicare Other | Admitting: *Deleted

## 2013-10-06 DIAGNOSIS — I442 Atrioventricular block, complete: Secondary | ICD-10-CM

## 2013-10-06 LAB — MDC_IDC_ENUM_SESS_TYPE_INCLINIC
Brady Statistic RA Percent Paced: 82.1 %
Lead Channel Impedance Value: 661 Ohm
Lead Channel Pacing Threshold Amplitude: 0.5 V
Lead Channel Pacing Threshold Pulse Width: 0.4 ms
Lead Channel Sensing Intrinsic Amplitude: 1.4 mV
Lead Channel Sensing Intrinsic Amplitude: 5.6 mV
MDC IDC MSMT BATTERY REMAINING LONGEVITY: 113 mo
MDC IDC MSMT LEADCHNL RA IMPEDANCE VALUE: 489 Ohm
MDC IDC MSMT LEADCHNL RV PACING THRESHOLD AMPLITUDE: 0.75 V
MDC IDC MSMT LEADCHNL RV PACING THRESHOLD PULSEWIDTH: 0.4 ms
MDC IDC STAT BRADY RV PERCENT PACED: 8.5 %

## 2013-10-06 NOTE — Progress Notes (Signed)
Wound check appointment. Steri-strips removed. Wound without redness or edema. Incision edges approximated, wound well healed. Normal device function. Thresholds, sensing, and impedances consistent with implant measurements. Device programmed at 3.5V/auto capture programmed on for extra safety margin until 3 month visit. Histogram distribution appropriate for patient and level of activity. No mode switches or high ventricular rates noted. Patient educated about wound care, arm mobility, lifting restrictions. ROV in 3 months with implanting physician.  Base rate to 60.

## 2013-10-26 ENCOUNTER — Inpatient Hospital Stay: Payer: Self-pay | Admitting: Internal Medicine

## 2013-10-26 LAB — COMPREHENSIVE METABOLIC PANEL
ALBUMIN: 2.2 g/dL — AB (ref 3.4–5.0)
ALK PHOS: 94 U/L
ANION GAP: 6 — AB (ref 7–16)
BUN: 24 mg/dL — ABNORMAL HIGH (ref 7–18)
Bilirubin,Total: 0.5 mg/dL (ref 0.2–1.0)
CALCIUM: 9.3 mg/dL (ref 8.5–10.1)
Chloride: 103 mmol/L (ref 98–107)
Co2: 28 mmol/L (ref 21–32)
Creatinine: 6.61 mg/dL — ABNORMAL HIGH (ref 0.60–1.30)
EGFR (Non-African Amer.): 7 — ABNORMAL LOW
GFR CALC AF AMER: 9 — AB
Glucose: 135 mg/dL — ABNORMAL HIGH (ref 65–99)
Osmolality: 280 (ref 275–301)
Potassium: 3.3 mmol/L — ABNORMAL LOW (ref 3.5–5.1)
SGOT(AST): 25 U/L (ref 15–37)
SGPT (ALT): 15 U/L (ref 12–78)
Sodium: 137 mmol/L (ref 136–145)
Total Protein: 7.5 g/dL (ref 6.4–8.2)

## 2013-10-26 LAB — CBC WITH DIFFERENTIAL/PLATELET
BASOS ABS: 0 10*3/uL (ref 0.0–0.1)
BASOS PCT: 0.4 %
Eosinophil #: 0 10*3/uL (ref 0.0–0.7)
Eosinophil %: 0 %
HCT: 19.2 % — AB (ref 40.0–52.0)
HGB: 6.2 g/dL — AB (ref 13.0–18.0)
Lymphocyte #: 1.3 10*3/uL (ref 1.0–3.6)
Lymphocyte %: 12.6 %
MCH: 29 pg (ref 26.0–34.0)
MCHC: 32.3 g/dL (ref 32.0–36.0)
MCV: 90 fL (ref 80–100)
Monocyte #: 0.9 x10 3/mm (ref 0.2–1.0)
Monocyte %: 8.8 %
NEUTROS PCT: 78.2 %
Neutrophil #: 8.4 10*3/uL — ABNORMAL HIGH (ref 1.4–6.5)
Platelet: 305 10*3/uL (ref 150–440)
RBC: 2.14 10*6/uL — ABNORMAL LOW (ref 4.40–5.90)
RDW: 17.3 % — ABNORMAL HIGH (ref 11.5–14.5)
WBC: 10.7 10*3/uL — ABNORMAL HIGH (ref 3.8–10.6)

## 2013-10-26 LAB — PRO B NATRIURETIC PEPTIDE: B-Type Natriuretic Peptide: 81192 pg/mL — ABNORMAL HIGH (ref 0–450)

## 2013-10-26 LAB — TROPONIN I
TROPONIN-I: 0.17 ng/mL — AB
Troponin-I: 0.14 ng/mL — ABNORMAL HIGH

## 2013-10-26 LAB — CK TOTAL AND CKMB (NOT AT ARMC)
CK, Total: 106 U/L (ref 35–232)
CK-MB: 1.5 ng/mL (ref 0.5–3.6)

## 2013-10-26 LAB — PROTIME-INR
INR: 1.2
PROTHROMBIN TIME: 15.3 s — AB (ref 11.5–14.7)

## 2013-10-26 LAB — TSH: Thyroid Stimulating Horm: 3.73 u[IU]/mL

## 2013-10-26 LAB — APTT: Activated PTT: 38.8 secs — ABNORMAL HIGH (ref 23.6–35.9)

## 2013-10-27 ENCOUNTER — Encounter: Payer: Self-pay | Admitting: Internal Medicine

## 2013-10-27 LAB — BASIC METABOLIC PANEL
Anion Gap: 7 (ref 7–16)
BUN: 25 mg/dL — AB (ref 7–18)
CALCIUM: 8.8 mg/dL (ref 8.5–10.1)
CREATININE: 6.79 mg/dL — AB (ref 0.60–1.30)
Chloride: 104 mmol/L (ref 98–107)
Co2: 27 mmol/L (ref 21–32)
EGFR (African American): 8 — ABNORMAL LOW
EGFR (Non-African Amer.): 7 — ABNORMAL LOW
Glucose: 148 mg/dL — ABNORMAL HIGH (ref 65–99)
Osmolality: 283 (ref 275–301)
Potassium: 3.3 mmol/L — ABNORMAL LOW (ref 3.5–5.1)
SODIUM: 138 mmol/L (ref 136–145)

## 2013-10-27 LAB — CBC WITH DIFFERENTIAL/PLATELET
Basophil #: 0 10*3/uL (ref 0.0–0.1)
Basophil %: 0.4 %
EOS ABS: 0 10*3/uL (ref 0.0–0.7)
Eosinophil %: 0 %
HCT: 30 % — AB (ref 40.0–52.0)
HGB: 9.5 g/dL — AB (ref 13.0–18.0)
Lymphocyte #: 1.2 10*3/uL (ref 1.0–3.6)
Lymphocyte %: 10.9 %
MCH: 28 pg (ref 26.0–34.0)
MCHC: 31.7 g/dL — ABNORMAL LOW (ref 32.0–36.0)
MCV: 88 fL (ref 80–100)
Monocyte #: 1 x10 3/mm (ref 0.2–1.0)
Monocyte %: 9.1 %
NEUTROS ABS: 8.4 10*3/uL — AB (ref 1.4–6.5)
Neutrophil %: 79.6 %
Platelet: 305 10*3/uL (ref 150–440)
RBC: 3.39 10*6/uL — AB (ref 4.40–5.90)
RDW: 17.6 % — ABNORMAL HIGH (ref 11.5–14.5)
WBC: 10.6 10*3/uL (ref 3.8–10.6)

## 2013-10-27 LAB — TSH: THYROID STIMULATING HORM: 3.62 u[IU]/mL

## 2013-10-27 LAB — PHOSPHORUS: Phosphorus: 3.8 mg/dL (ref 2.5–4.9)

## 2013-10-27 LAB — TROPONIN I: Troponin-I: 0.19 ng/mL — ABNORMAL HIGH

## 2013-10-27 LAB — MAGNESIUM: Magnesium: 1.9 mg/dL

## 2013-10-28 LAB — CBC WITH DIFFERENTIAL/PLATELET
BASOS ABS: 0 10*3/uL (ref 0.0–0.1)
Basophil %: 0.5 %
EOS ABS: 0 10*3/uL (ref 0.0–0.7)
Eosinophil %: 0 %
HCT: 31.3 % — AB (ref 40.0–52.0)
HGB: 9.9 g/dL — ABNORMAL LOW (ref 13.0–18.0)
Lymphocyte #: 1.8 10*3/uL (ref 1.0–3.6)
Lymphocyte %: 18.3 %
MCH: 28.3 pg (ref 26.0–34.0)
MCHC: 31.7 g/dL — ABNORMAL LOW (ref 32.0–36.0)
MCV: 89 fL (ref 80–100)
MONO ABS: 0.9 x10 3/mm (ref 0.2–1.0)
Monocyte %: 8.7 %
NEUTROS ABS: 7.3 10*3/uL — AB (ref 1.4–6.5)
Neutrophil %: 72.5 %
Platelet: 265 10*3/uL (ref 150–440)
RBC: 3.5 10*6/uL — ABNORMAL LOW (ref 4.40–5.90)
RDW: 17.8 % — ABNORMAL HIGH (ref 11.5–14.5)
WBC: 10.1 10*3/uL (ref 3.8–10.6)

## 2013-10-28 LAB — BASIC METABOLIC PANEL
ANION GAP: 8 (ref 7–16)
BUN: 15 mg/dL (ref 7–18)
CALCIUM: 8.8 mg/dL (ref 8.5–10.1)
Chloride: 104 mmol/L (ref 98–107)
Co2: 32 mmol/L (ref 21–32)
Creatinine: 4.95 mg/dL — ABNORMAL HIGH (ref 0.60–1.30)
EGFR (African American): 12 — ABNORMAL LOW
EGFR (Non-African Amer.): 11 — ABNORMAL LOW
Glucose: 109 mg/dL — ABNORMAL HIGH (ref 65–99)
Osmolality: 288 (ref 275–301)
Potassium: 3.4 mmol/L — ABNORMAL LOW (ref 3.5–5.1)
SODIUM: 144 mmol/L (ref 136–145)

## 2013-10-29 LAB — BASIC METABOLIC PANEL
Anion Gap: 8 (ref 7–16)
BUN: 22 mg/dL — ABNORMAL HIGH (ref 7–18)
CALCIUM: 8.6 mg/dL (ref 8.5–10.1)
CHLORIDE: 104 mmol/L (ref 98–107)
CO2: 30 mmol/L (ref 21–32)
Creatinine: 6.74 mg/dL — ABNORMAL HIGH (ref 0.60–1.30)
EGFR (African American): 8 — ABNORMAL LOW
EGFR (Non-African Amer.): 7 — ABNORMAL LOW
Glucose: 114 mg/dL — ABNORMAL HIGH (ref 65–99)
Osmolality: 287 (ref 275–301)
POTASSIUM: 4 mmol/L (ref 3.5–5.1)
SODIUM: 142 mmol/L (ref 136–145)

## 2013-10-29 LAB — CBC WITH DIFFERENTIAL/PLATELET
BASOS ABS: 0.1 10*3/uL (ref 0.0–0.1)
BASOS PCT: 1.1 %
EOS ABS: 0 10*3/uL (ref 0.0–0.7)
EOS PCT: 0 %
HCT: 29.1 % — AB (ref 40.0–52.0)
HGB: 9 g/dL — AB (ref 13.0–18.0)
LYMPHS PCT: 10.9 %
Lymphocyte #: 1.4 10*3/uL (ref 1.0–3.6)
MCH: 27.4 pg (ref 26.0–34.0)
MCHC: 30.9 g/dL — ABNORMAL LOW (ref 32.0–36.0)
MCV: 89 fL (ref 80–100)
MONO ABS: 1 x10 3/mm (ref 0.2–1.0)
Monocyte %: 7.8 %
Neutrophil #: 10.1 10*3/uL — ABNORMAL HIGH (ref 1.4–6.5)
Neutrophil %: 80.2 %
PLATELETS: 293 10*3/uL (ref 150–440)
RBC: 3.28 10*6/uL — ABNORMAL LOW (ref 4.40–5.90)
RDW: 17.7 % — ABNORMAL HIGH (ref 11.5–14.5)
WBC: 12.6 10*3/uL — ABNORMAL HIGH (ref 3.8–10.6)

## 2013-10-29 LAB — OCCULT BLOOD X 1 CARD TO LAB, STOOL: OCCULT BLOOD, FECES: NEGATIVE

## 2013-10-29 LAB — PHOSPHORUS: PHOSPHORUS: 2.6 mg/dL (ref 2.5–4.9)

## 2013-10-31 LAB — CBC WITH DIFFERENTIAL/PLATELET
Basophil #: 0 10*3/uL (ref 0.0–0.1)
Basophil %: 0.4 %
EOS ABS: 0 10*3/uL (ref 0.0–0.7)
Eosinophil %: 0 %
HCT: 29.2 % — AB (ref 40.0–52.0)
HGB: 9.5 g/dL — AB (ref 13.0–18.0)
LYMPHS ABS: 1.4 10*3/uL (ref 1.0–3.6)
LYMPHS PCT: 14.5 %
MCH: 28.9 pg (ref 26.0–34.0)
MCHC: 32.6 g/dL (ref 32.0–36.0)
MCV: 89 fL (ref 80–100)
MONO ABS: 0.8 x10 3/mm (ref 0.2–1.0)
MONOS PCT: 8 %
NEUTROS ABS: 7.5 10*3/uL — AB (ref 1.4–6.5)
Neutrophil %: 77.1 %
Platelet: 314 10*3/uL (ref 150–440)
RBC: 3.29 10*6/uL — ABNORMAL LOW (ref 4.40–5.90)
RDW: 17.4 % — AB (ref 11.5–14.5)
WBC: 9.7 10*3/uL (ref 3.8–10.6)

## 2013-10-31 LAB — PHOSPHORUS: PHOSPHORUS: 1.9 mg/dL — AB (ref 2.5–4.9)

## 2013-10-31 LAB — BASIC METABOLIC PANEL
Anion Gap: 5 — ABNORMAL LOW (ref 7–16)
BUN: 22 mg/dL — ABNORMAL HIGH (ref 7–18)
CO2: 34 mmol/L — AB (ref 21–32)
Calcium, Total: 8.4 mg/dL — ABNORMAL LOW (ref 8.5–10.1)
Chloride: 98 mmol/L (ref 98–107)
Creatinine: 6.31 mg/dL — ABNORMAL HIGH (ref 0.60–1.30)
GFR CALC AF AMER: 9 — AB
GFR CALC NON AF AMER: 8 — AB
GLUCOSE: 104 mg/dL — AB (ref 65–99)
OSMOLALITY: 277 (ref 275–301)
Potassium: 3.3 mmol/L — ABNORMAL LOW (ref 3.5–5.1)
Sodium: 137 mmol/L (ref 136–145)

## 2013-10-31 LAB — CULTURE, BLOOD (SINGLE)

## 2013-10-31 LAB — VANCOMYCIN, TROUGH: Vancomycin, Trough: 20 ug/mL (ref 10–20)

## 2013-11-01 LAB — CBC WITH DIFFERENTIAL/PLATELET
Basophil #: 0.1 10*3/uL (ref 0.0–0.1)
Basophil %: 0.8 %
Eosinophil #: 0 10*3/uL (ref 0.0–0.7)
Eosinophil %: 0.1 %
HCT: 31.5 % — ABNORMAL LOW (ref 40.0–52.0)
HGB: 9.7 g/dL — ABNORMAL LOW (ref 13.0–18.0)
Lymphocyte #: 1.8 10*3/uL (ref 1.0–3.6)
Lymphocyte %: 15.5 %
MCH: 27.2 pg (ref 26.0–34.0)
MCHC: 30.6 g/dL — ABNORMAL LOW (ref 32.0–36.0)
MCV: 89 fL (ref 80–100)
MONOS PCT: 6.8 %
Monocyte #: 0.8 x10 3/mm (ref 0.2–1.0)
NEUTROS ABS: 8.8 10*3/uL — AB (ref 1.4–6.5)
NEUTROS PCT: 76.8 %
Platelet: 362 10*3/uL (ref 150–440)
RBC: 3.55 10*6/uL — ABNORMAL LOW (ref 4.40–5.90)
RDW: 17.2 % — ABNORMAL HIGH (ref 11.5–14.5)
WBC: 11.4 10*3/uL — ABNORMAL HIGH (ref 3.8–10.6)

## 2013-11-01 LAB — BASIC METABOLIC PANEL
Anion Gap: 5 — ABNORMAL LOW (ref 7–16)
BUN: 16 mg/dL (ref 7–18)
CHLORIDE: 98 mmol/L (ref 98–107)
CO2: 33 mmol/L — AB (ref 21–32)
Calcium, Total: 8.4 mg/dL — ABNORMAL LOW (ref 8.5–10.1)
Creatinine: 4.97 mg/dL — ABNORMAL HIGH (ref 0.60–1.30)
GFR CALC AF AMER: 12 — AB
GFR CALC NON AF AMER: 10 — AB
Glucose: 134 mg/dL — ABNORMAL HIGH (ref 65–99)
OSMOLALITY: 275 (ref 275–301)
Potassium: 3.7 mmol/L (ref 3.5–5.1)
Sodium: 136 mmol/L (ref 136–145)

## 2013-11-02 DIAGNOSIS — I369 Nonrheumatic tricuspid valve disorder, unspecified: Secondary | ICD-10-CM

## 2013-11-02 LAB — CULTURE, BLOOD (SINGLE)

## 2013-11-03 LAB — PHOSPHORUS: PHOSPHORUS: 2.5 mg/dL (ref 2.5–4.9)

## 2013-11-04 ENCOUNTER — Ambulatory Visit: Payer: Self-pay | Admitting: Nurse Practitioner

## 2013-11-05 LAB — BASIC METABOLIC PANEL
ANION GAP: 12 (ref 7–16)
BUN: 26 mg/dL — ABNORMAL HIGH (ref 7–18)
CHLORIDE: 95 mmol/L — AB (ref 98–107)
CO2: 30 mmol/L (ref 21–32)
CREATININE: 6.59 mg/dL — AB (ref 0.60–1.30)
Calcium, Total: 8.7 mg/dL (ref 8.5–10.1)
EGFR (Non-African Amer.): 7 — ABNORMAL LOW
GFR CALC AF AMER: 9 — AB
Glucose: 77 mg/dL (ref 65–99)
Osmolality: 277 (ref 275–301)
Potassium: 3.9 mmol/L (ref 3.5–5.1)
Sodium: 137 mmol/L (ref 136–145)

## 2013-11-05 LAB — HEMOGLOBIN: HGB: 9.7 g/dL — ABNORMAL LOW (ref 13.0–18.0)

## 2013-11-05 LAB — PHOSPHORUS: Phosphorus: 2.6 mg/dL (ref 2.5–4.9)

## 2013-11-07 LAB — POTASSIUM: Potassium: 3.7 mmol/L (ref 3.5–5.1)

## 2013-11-07 LAB — PHOSPHORUS: PHOSPHORUS: 2.8 mg/dL (ref 2.5–4.9)

## 2013-11-07 LAB — CULTURE, BLOOD (SINGLE)

## 2013-11-07 LAB — VANCOMYCIN, RANDOM: Vancomycin, Random: 21 ug/mL

## 2013-11-07 LAB — HEMOGLOBIN: HGB: 9 g/dL — AB (ref 13.0–18.0)

## 2013-11-08 LAB — HEMOGLOBIN A1C: Hemoglobin A1C: 5.7 % (ref 4.2–6.3)

## 2013-11-10 LAB — PHOSPHORUS: Phosphorus: 3.8 mg/dL (ref 2.5–4.9)

## 2013-11-10 LAB — BASIC METABOLIC PANEL
ANION GAP: 1 — AB (ref 7–16)
BUN: 42 mg/dL — AB (ref 7–18)
CO2: 37 mmol/L — AB (ref 21–32)
CREATININE: 8.07 mg/dL — AB (ref 0.60–1.30)
Calcium, Total: 8.9 mg/dL (ref 8.5–10.1)
Chloride: 99 mmol/L (ref 98–107)
EGFR (Non-African Amer.): 6 — ABNORMAL LOW
GFR CALC AF AMER: 7 — AB
Glucose: 103 mg/dL — ABNORMAL HIGH (ref 65–99)
OSMOLALITY: 285 (ref 275–301)
Potassium: 4.3 mmol/L (ref 3.5–5.1)
Sodium: 137 mmol/L (ref 136–145)

## 2013-11-10 LAB — HEMOGLOBIN: HGB: 8.4 g/dL — AB (ref 13.0–18.0)

## 2013-11-23 ENCOUNTER — Ambulatory Visit: Payer: Self-pay | Admitting: Family Medicine

## 2013-11-25 ENCOUNTER — Other Ambulatory Visit: Payer: Self-pay | Admitting: Nephrology

## 2013-11-25 LAB — POTASSIUM: Potassium: 3.4 mmol/L — ABNORMAL LOW (ref 3.5–5.1)

## 2013-12-09 ENCOUNTER — Emergency Department: Payer: Self-pay | Admitting: Emergency Medicine

## 2013-12-09 LAB — CBC WITH DIFFERENTIAL/PLATELET
Basophil #: 0 10*3/uL (ref 0.0–0.1)
Basophil %: 0.8 %
EOS PCT: 0 %
Eosinophil #: 0 10*3/uL (ref 0.0–0.7)
HCT: 30.7 % — AB (ref 40.0–52.0)
HGB: 9.8 g/dL — AB (ref 13.0–18.0)
Lymphocyte #: 1.1 10*3/uL (ref 1.0–3.6)
Lymphocyte %: 20 %
MCH: 28.2 pg (ref 26.0–34.0)
MCHC: 32.1 g/dL (ref 32.0–36.0)
MCV: 88 fL (ref 80–100)
MONO ABS: 0.4 x10 3/mm (ref 0.2–1.0)
MONOS PCT: 8 %
Neutrophil #: 3.9 10*3/uL (ref 1.4–6.5)
Neutrophil %: 71.2 %
Platelet: 252 10*3/uL (ref 150–440)
RBC: 3.49 10*6/uL — ABNORMAL LOW (ref 4.40–5.90)
RDW: 21.8 % — AB (ref 11.5–14.5)
WBC: 5.5 10*3/uL (ref 3.8–10.6)

## 2013-12-09 LAB — BASIC METABOLIC PANEL
Anion Gap: 9 (ref 7–16)
BUN: 18 mg/dL (ref 7–18)
CHLORIDE: 105 mmol/L (ref 98–107)
CREATININE: 6.18 mg/dL — AB (ref 0.60–1.30)
Calcium, Total: 8.4 mg/dL — ABNORMAL LOW (ref 8.5–10.1)
Co2: 23 mmol/L (ref 21–32)
EGFR (African American): 9 — ABNORMAL LOW
EGFR (Non-African Amer.): 8 — ABNORMAL LOW
GLUCOSE: 93 mg/dL (ref 65–99)
Osmolality: 275 (ref 275–301)
Potassium: 3 mmol/L — ABNORMAL LOW (ref 3.5–5.1)
Sodium: 137 mmol/L (ref 136–145)

## 2013-12-18 ENCOUNTER — Inpatient Hospital Stay: Payer: Self-pay | Admitting: Podiatry

## 2013-12-18 DIAGNOSIS — Z01818 Encounter for other preprocedural examination: Secondary | ICD-10-CM

## 2013-12-18 DIAGNOSIS — I96 Gangrene, not elsewhere classified: Secondary | ICD-10-CM

## 2013-12-18 LAB — BASIC METABOLIC PANEL
Anion Gap: 7 (ref 7–16)
BUN: 17 mg/dL (ref 7–18)
Calcium, Total: 8.3 mg/dL — ABNORMAL LOW (ref 8.5–10.1)
Chloride: 101 mmol/L (ref 98–107)
Co2: 29 mmol/L (ref 21–32)
Creatinine: 4.71 mg/dL — ABNORMAL HIGH (ref 0.60–1.30)
EGFR (African American): 13 — ABNORMAL LOW
EGFR (Non-African Amer.): 11 — ABNORMAL LOW
Glucose: 80 mg/dL (ref 65–99)
Osmolality: 274 (ref 275–301)
Potassium: 2.7 mmol/L — ABNORMAL LOW (ref 3.5–5.1)
Sodium: 137 mmol/L (ref 136–145)

## 2013-12-18 LAB — CBC WITH DIFFERENTIAL/PLATELET
BASOS ABS: 0 10*3/uL (ref 0.0–0.1)
Basophil %: 0.6 %
EOS ABS: 0 10*3/uL (ref 0.0–0.7)
EOS PCT: 0 %
HCT: 32.3 % — AB (ref 40.0–52.0)
HGB: 10.1 g/dL — ABNORMAL LOW (ref 13.0–18.0)
LYMPHS ABS: 1.5 10*3/uL (ref 1.0–3.6)
Lymphocyte %: 18.5 %
MCH: 27.7 pg (ref 26.0–34.0)
MCHC: 31.4 g/dL — AB (ref 32.0–36.0)
MCV: 88 fL (ref 80–100)
Monocyte #: 0.7 x10 3/mm (ref 0.2–1.0)
Monocyte %: 9 %
NEUTROS ABS: 6 10*3/uL (ref 1.4–6.5)
NEUTROS PCT: 71.9 %
PLATELETS: 315 10*3/uL (ref 150–440)
RBC: 3.66 10*6/uL — ABNORMAL LOW (ref 4.40–5.90)
RDW: 22.2 % — ABNORMAL HIGH (ref 11.5–14.5)
WBC: 8.3 10*3/uL (ref 3.8–10.6)

## 2013-12-18 LAB — MAGNESIUM: Magnesium: 1.9 mg/dL

## 2013-12-19 ENCOUNTER — Ambulatory Visit: Payer: Self-pay | Admitting: Internal Medicine

## 2013-12-24 ENCOUNTER — Ambulatory Visit: Payer: Self-pay | Admitting: Internal Medicine

## 2014-01-01 ENCOUNTER — Ambulatory Visit: Payer: Self-pay | Admitting: Vascular Surgery

## 2014-01-01 LAB — BASIC METABOLIC PANEL
Anion Gap: 4 — ABNORMAL LOW (ref 7–16)
BUN: 19 mg/dL — ABNORMAL HIGH (ref 7–18)
CALCIUM: 7.7 mg/dL — AB (ref 8.5–10.1)
CO2: 31 mmol/L (ref 21–32)
CREATININE: 5.32 mg/dL — AB (ref 0.60–1.30)
Chloride: 101 mmol/L (ref 98–107)
EGFR (African American): 11 — ABNORMAL LOW
GFR CALC NON AF AMER: 10 — AB
GLUCOSE: 128 mg/dL — AB (ref 65–99)
Osmolality: 276 (ref 275–301)
Potassium: 2.9 mmol/L — ABNORMAL LOW (ref 3.5–5.1)
Sodium: 136 mmol/L (ref 136–145)

## 2014-01-01 LAB — GLUCOSE, RANDOM: GLUCOSE: 57 mg/dL — AB (ref 65–99)

## 2014-01-08 ENCOUNTER — Encounter: Payer: Self-pay | Admitting: *Deleted

## 2014-01-23 DEATH — deceased

## 2014-03-05 ENCOUNTER — Encounter: Payer: Self-pay | Admitting: *Deleted

## 2014-04-21 ENCOUNTER — Encounter: Payer: Self-pay | Admitting: *Deleted

## 2014-05-05 ENCOUNTER — Encounter: Payer: Self-pay | Admitting: *Deleted

## 2014-06-24 ENCOUNTER — Encounter: Payer: Self-pay | Admitting: *Deleted

## 2015-01-12 NOTE — Op Note (Signed)
PATIENT NAME:  [name], [name] MR#:  [id number] DATE OF BIRTH:  July 07, 1935  DATE OF PROCEDURE:  08/06/2012  PREOPERATIVE DIAGNOSIS: Thrombosis left forearm loop graft.   POSTOPERATIVE DIAGNOSIS: Thrombosis left forearm loop graft.  PROCEDURES PERFORMED:  1. Contrast injection left forearm loop graft.  2. Mechanical thrombectomy left forearm loop graft.  3. Percutaneous transluminal angioplasty, venous portion.  4. Percutaneous transluminal angioplasty, arterial portion.   PROCEDURE PERFORMED BY: Renford Dills, MD   SEDATION: Versed 4 mg plus fentanyl 150 mcg administered IV. Continuous ECG, pulse oximetry and cardiopulmonary monitoring is performed throughout the entire procedure by the Interventional Radiology nurse. Total sedation time was 1 hour, 30 minutes.   ACCESS:  1. A 7-French sheath, antegrade direction.  2. A 6-French sheath, retrograde direction.   CONTRAST USED: Isovue 90 mL.   FLUOROSCOPY TIME:  10.5 minutes.   INDICATIONS: [name] is a 80 year old gentleman who presents with thrombosis of his dialysis access. The risks and benefits for thrombectomy were reviewed. All questions are answered. The patient agrees to proceed.   DESCRIPTION OF PROCEDURE: The patient is taken to Special Procedures and placed in the supine position. After adequate sedation is achieved, the left arm is extended, prepped and draped and positioned palm upward.   One percent lidocaine is infiltrated in the soft tissues on the lateral aspect of the loop graft, and access is obtained. A small hand injection of contrast is utilized to demonstrate thrombus. A KMP catheter and Glidewire are then negotiated up to the shoulder area where hand injection of contrast demonstrates the venous outflow and the central venous anatomy. The patient has multiple, it appears to be 3 or 4, Viabahn stents that extend from his loop graft all the way up to the axilla in the venous outflow, which is a very unfortunate  situation.   Heparin, 4000 units, is given and the Trerotola device is opened onto the field. It is advanced using fluoroscopic guidance to this just about the central veins where it is engaged and slowly withdrawn. Multiple passes were made.   A retrograde 6-French sheath is then placed at the apex of the graft on the more medial side, and the arterial portion of the graft is treated with the Trerotola. Forward flow was not established. The KMP catheter is advanced into the brachial artery and slowly pulled back into the graft with selective hand injections demonstrating a high-grade stenosis at the arterial anastomosis. The graft appears to be a 6 tapered,  and therefore an 8 x 6 balloon is advanced over a Magic Torque Wire and then used to angioplasty the arterial portion. Flow is then established; however, there appeared to be two other spots, one at the apex and one more proximally in the venous outflow which are limiting flow. Multiple passes with the Trerotola as well as shifting the wire from the retrograde sheath to the antegrade sheath. Ultimately, the sheath had to be up-sized to a 7-French, but the angioplasty at the level of the humeral head in the venous outflow treats this stenotic area quite well. There was also, as noted, an area in the apex that was also treated with the 8 balloon. Ultimately, forward flow was established. The graft itself appears to be in reasonable condition. The stented segment is free of in-stent restenosis, however, it is, as noted, quite long. Venous outflow appears quite good at this point with a 7 mm venous outflow into the subclavian. The central veins are widely patent.   INTERPRETATION:  Initial view showed thrombus within the entire graft. After multiple passes the thrombus is cleared.  Several strictures are noted, and these are treated with balloon angioplasty. Central veins are all widely patent.   SUMMARY: Successful thrombectomy with treatment of left  forearm loop graft.   ____________________________ Renford DillsGregory G. Jayland Null, MD ggs:cbb D: 08/06/2012 16:30:21 ET T: 08/06/2012 17:20:09 ET JOB#: [id number]  cc: Renford DillsGregory G. Alarik Radu, MD, <Dictator> Renford DillsGREGORY G Ronne Stefanski MD ELECTRONICALLY SIGNED 08/10/2012 16:26

## 2015-01-12 NOTE — Op Note (Signed)
PATIENT NAME:  [name], [name] MR#:  [id number] DATE OF BIRTH:  Dec 24, 1934  DATE OF PROCEDURE:  08/20/2012  PREOPERATIVE DIAGNOSES:  1. End-stage renal disease.  2. Clotted left arm AV graft.  3. Hypertension.   POSTOPERATIVE DIAGNOSES: 1. End-stage renal disease.  2. Clotted left arm AV graft.  3. Hypertension.   PROCEDURES:  1. Ultrasound guidance for vascular access to the graft in an antegrade and retrograde fashion crossing.  2. Left upper extremity shuntogram and central venogram.  3. Catheter directed thrombolysis with 4 mg of TPA delivered with the AngioJet AVX catheter.  4. Mechanical rheolytic thrombectomy performed with the AngioJet AVX catheter.  5. Percutaneous transluminal angioplasty with a 7 mm diameter angioplasty balloon to the arterial access site, to the venous access site, and to the axillary vein all separate for greater than 50% stenosis or residual thrombosis causing flow limitation.   SURGEON: Annice NeedyJason S. Dew, MD   ANESTHESIA: Local with moderate conscious sedation.   ESTIMATED BLOOD LOSS: Approximately 25 mL.  INDICATION FOR PROCEDURE: The patient is a 80 year old African American male with end-stage renal disease. He has a clotted graft and we are going to attempt to salvage this.   DESCRIPTION OF PROCEDURE: The patient was brought to the Vascular Interventional Radiology Suite. The left upper extremity was sterilely prepped and draped and a sterile surgical field was created. The graft was accessed under direct ultrasound guidance without difficulty with a micropuncture needle in both an antegrade and retrograde fashion crossing and permanent image was recorded. We upsized to a 6 [place] sheath, gave the patient 3000 units of intravenous heparin for systemic anticoagulation. Magic torque wire was used to park into the brachial artery and the axillary vein and I then instilled 4 mg of TPA throughout the forearm loop graft and the brachial vein where the thrombus was  seen on initial angiogram. This was allowed to dwell for 15 minutes. I then performed mechanical rheolytic thrombectomy from the arterial anastomosis into the brachial vein. Following this, there was still some thrombus and flow limitation around both of the sheaths which were at the arterial and venous access sites. These were treated with a 7 mm diameter angioplasty balloon with good angiographic result. The arterial access site was treated with the initial sheath in place but then we were able to remove the retrograde sheath and treat the venous access site with sheath removed. On central venous imaging there was some flow limitation in the axillary vein that was in the 60 to 70% range. It looked like there was probably some new and old thrombus as well as a vein valve stenosis. This was also treated with a 7 mm diameter angioplasty balloon with good angiographic completion result and significantly improved flow through the graft. There was a nice pulse in the graft. Sheath was removed around a 4-0 Monocryl purse-string suture, pressure was held, and a sterile dressing was placed. The patient tolerated the procedure well and was taken to the recovery room in stable condition.   ____________________________ Annice NeedyJason S. Dew, MD jsd:drc D: 08/20/2012 11:45:06 ET T: 08/20/2012 12:09:18 ET JOB#: [id number]  cc: Annice NeedyJason S. Dew, MD, <Dictator> Annice NeedyJASON S DEW MD ELECTRONICALLY SIGNED 08/21/2012 9:44

## 2015-01-15 NOTE — Consult Note (Signed)
PATIENT NAME:  [name], [name] MR#:  [id number] DATE OF BIRTH:  06/23/1935  DATE OF CONSULTATION:  09/11/2013  CONSULTING PHYSICIAN:  Gabriel DownsJaved Maury Groninger, MD  REPORT OF CONSULTATION: Gabriel LawsJames [name] was seen in the hospital in ICU for severe junctional bradycardia. The patient is on a dopamine drip to maintain the rhythm. Heart rate at the present time is 50 to 54. He is alert, responsive. The patient is 80 years old, and he is known to have hypertension, end-stage diabetes on hemodialysis. Has moderate dementia, congestive heart failure. The patient is legally blind. In the Emergency Room, he was found to be hypotensive, so admitted in ICU. His electrolytes were out of balance, with an elevated potassium which is slowly coming down. The patient had a cardiac cath in May 2014, with RCA and circumflex disease, with moderate left anterior descending coronary artery disease. The patient is on aggressive medical management.   SOCIAL HISTORY: The patient lives at home with his wife, who is wheelchair bound.   ALLERGIES: OXYCODONE, METOPROLOL, ACE INHIBITOR.   For the rest of the detailed history, please see detailed history and physical note.   CLINICAL DATA: Phosphorus was 6.4, potassium 4.5. Hemoglobin 10.2, WBC count 5.7. Electrocardiogram revealed atrial fibrillation with slow ventricular response.   IMPRESSIONS:  1.  Severe junctional bradycardia. 2.  Angina, with coronary artery disease. 3.  End-stage renal disease, on dialysis.  4.  Hypertension.  5.  Morbid exogenous obesity.   RECOMMENDATIONS: The patient is a candidate for insertion of a permanent pacemaker. The procedure was explained to him in front of a nurse. It was also discussed on telephone with his wife. Will go ahead with insertion of a permanent pacemaker tomorrow in the Operating Room. Risks and benefits were explained to the patient and his wife, and they seem to understand it.    ____________________________ Gabriel DownsJaved Gabriel Pierre,  MD jm:mr D: 09/11/2013 18:28:14 ET T: 09/11/2013 19:26:58 ET JOB#: [id number]  cc: Gabriel DownsJaved Gabriel Brafford, MD, <Dictator> Gabriel DownsJAVED Gabriel Galentine MD ELECTRONICALLY SIGNED 09/24/2013 13:40

## 2015-01-15 NOTE — Consult Note (Signed)
CHIEF COMPLAINT and HISTORY:  History of Present Illness 80 year old African American male admitted with confusion and hypotension. He has ESRD on hemodialysis. He is s/p shuntogram on 12/9 with angioplasty left innominate vein and angioplasty venous anastomosis left brachial axillary AV graft. Also found to have elevated troponin and is being seen by cardiology for NSTEMI.  Patient reports doing well today. Denies chest pain or shortness of breath.   PAST MEDICAL/SURGICAL HISTORY:  Past Medical History:   blind:    Hypertension:    Renal Failure:    Diabetes:    Appendectomy:   ALLERGIES:  Allergies:  Metoprolol: Cough  Oxycodone: Cough, Hallucinations  Ace Inhibitors: Unknown  HOME MEDICATIONS:  Home Medications: Medication Instructions Status  furosemide 80 mg oral tablet 1.5 tab(s) orally once a day (in the morning) Active  hydrALAZINE 50 mg oral tablet 1 tab(s) orally 3 times a day Active  aspirin 81 mg oral tablet 1 tab(s) orally once a day (in the morning) Active  clopidogrel 75 mg oral tablet 1 tab(s) orally once a day (in the evening) Active  Dialyvite 800 Vitamin B Complex with C and Folic Acid oral tablet 1 tab(s) orally once a day (in the morning) Active  levothyroxine 100 mcg (0.1 mg) oral tablet 1 tab(s) orally once a day (in the morning) Active  Renvela 800 mg oral tablet 1 tab(s) orally 3 times a day (with meals) and 1 tab with snacks Active  Sensipar 60 mg oral tablet 1 tab(s) orally twice, As Needed Active  lisinopril 10 mg oral tablet 1 tab(s) orally once a day (in the morning) Active  carvedilol 6.25 mg oral tablet 1 tab(s) orally 2 times a day Active  Lipitor 10 mg oral tablet 1 tab(s) orally once a day (at bedtime) Active   Family and Social History:  Family History Hypertension  Diabetes Mellitus   Social History negative tobacco, negative ETOH, negative Illicit drugs   Place of Living Home   Review of Systems:  Fever/Chills No   Cough Yes    Abdominal Pain No   Nausea/Vomiting No   SOB/DOE No   Chest Pain No   Physical Exam:  GEN no acute distress, obese   HEENT moist oral mucosa   NECK supple  No masses   RESP normal resp effort  clear BS   CARD regular rate  murmur present   VASCULAR ACCESS AV graft present  Good bruit  Good thrill  left brach ax AVG   ABD denies tenderness  soft   LYMPH negative neck   SKIN normal to palpation   PSYCH alert   LABS:  Laboratory Results: LabObservation:    10-Dec-14 20:10, Echo Doppler  OBSERVATION   Reason for Test  Hepatic:    10-Dec-14 08:51, Comprehensive Metabolic Panel  Bilirubin, Total 0.8  Alkaline Phosphatase 60  45-117  NOTE: New Reference Range  08/15/13  SGPT (ALT) 28  SGOT (AST) 89  Total Protein, Serum 7.8  Albumin, Serum 3.2  Routine Micro:    10-Dec-14 10:45, Blood Culture  Micro Text Report   BLOOD CULTURE    COMMENT                   NO GROWTH IN 18-24 HOURS     ANTIBIOTIC  Culture Comment   NO GROWTH IN 18-24 HOURS   Result(s) reported on 04 Sep 2013 at 10:00AM.    10-Dec-14 10:50, Blood Culture  Micro Text Report   BLOOD CULTURE  COMMENT                   NO GROWTH IN 18-24 HOURS     ANTIBIOTIC  Culture Comment   NO GROWTH IN 18-24 HOURS   Result(s) reported on 04 Sep 2013 at 10:00AM.  Lab:    10-Dec-14 09:10, ABG and Lactic Acid  pH (ABG) 7.41  PCO2 28  PO2 84  FiO2 30  Base Excess -5.5  HCO3 17.7  O2 Saturation 95.8  O2 Device Sisters  Specimen Site (ABG)   RT RADIAL  Specimen Type (ABG) ARTERIAL  Patient Temp (ABG) 37.0  Result(s) reported on 03 Sep 2013 at 09:17AM.  Cardiology:    10-Dec-14 20:10, Echo Doppler  Echo Doppler   REASON FOR EXAM:      COMMENTS:       PROCEDURE: [facility] - ECHO DOPPLER COMPLETE(TRANSTHOR)  - Sep 03 2013  8:10PM     RESULT: Echocardiogram Report    Patient Name:   [name] BOGGUS SR Date of Exam: 09/03/2013  Medical Rec #:  [id number]   Custom1:  Date of Birth:  [birth year]               Height:  Patient Age:    80 years                Weight:       295.0 lb  Patient Gender: M                       BSA:          2.62 m??    Indications: Irregular EKG  Sonographer:    Erin Fulling  Referring Phys: Gladstone Lighter    Sonographer Comments: Body habitus.    Summary:   1. Challenging images secondary to body habitus.   2. Left ventricular ejection fraction, by visual estimation, is 45 to   50%.   3. Mildly decreased global left ventricular systolic function.   4. Unable to exclude regional wall motion abnormalities.   5. Unable to determine diastolic function parameters.   6. Mildly dilated RV with mild to moderately reduced systolic function.   7. Mildly dilated left atrium.   8. Mildly dilated right atrium.   9. Mild tricuspid regurgitation.  10. Normal RVSP  2D AND M-MODE MEASUREMENTS (normal ranges within parentheses):  Left Ventricle:          Normal  IVSd (2D):      1.28 cm (0.7-1.1)  LVPWd (2D):     1.18 cm (0.7-1.1) Aorta/LA:                  Normal  LVIDd (2D):     5.01 cm (3.4-5.7) Aortic Root (2D): 3.60 cm (2.4-3.7)  LVIDs (2D):     3.70 cm           Left Atrium (2D): 4.20 cm (1.9-4.0)  LV FS (2D):     26.1 %   (>25%)  LV EF (2D):     51.1 %   (>50%)                                    Right Ventricle:  RVd (2D):        3.70 cm  SPECTRAL DOPPLER ANALYSIS (where applicable):  Tricuspid Valve and PA/RV Systolic Pressure: TR Max Velocity: 2.13 m/s RA   Pressure: 15 mmHg RVSP/PASP: 33.2 mmHg  PHYSICIAN INTERPRETATION:  Left Ventricle: The left ventricular internal cavity size was normal. LV   posterior wall thickness was normal. Global LV systolic function was   mildly decreased. Left ventricular ejection fraction,by visual   estimation, is 45 to 50%.  Right Ventricle: The right ventricular size is mildly enlarged. Global RV   systolic function is mildly reduced.  Left Atrium: The left atrium is mildly  dilated.  Right Atrium: The right atrium is mildly dilated.  Tricuspid Valve: The tricuspid valve is normal. Mild tricuspid   regurgitation is visualized. The tricuspid regurgitant velocity is 2.13   m/s, and with an assumed right atrial pressure of 15 mmHg, the estimated   right ventricular systolic pressure is normal at 33.2 mmHg.  Aortic Valve: The aortic valve is normal. Mild aortic valve sclerosis is   present, with no evidence of aortic valve stenosis.  [id number] Ida Rogue MD  Electronically signed by [id number] Ida Rogue MD  Signature Date/Time: 09/04/2013/8:13:29 AM    *** Final ***    IMPRESSION: .        Verified By: Minna Merritts, M.D., MD  Routine Chem:    10-Dec-14 08:51, Comprehensive Metabolic Panel  Glucose, Serum 151  BUN 58  Creatinine (comp) 11.04  Sodium, Serum 134  Potassium, Serum 6.2  Chloride, Serum 101  CO2, Serum 19  Calcium (Total), Serum 9.3  Osmolality (calc) 287  eGFR (African American) 5  eGFR (Non-African American) 4  eGFR values <57m/min/1.73 m2 may be an indication of chronic  kidney disease (CKD).  Calculated eGFR is useful in patients with stable renal function.  The eGFR calculation will not be reliable in acutely ill patients  when serum creatinine is changing rapidly. It is not useful in   patients on dialysis. The eGFR calculation may not be applicable  to patients at the low and high extremes of body sizes, pregnant  women, and vegetarians.  Result Comment   POTASSIUM/AST/CK - Slight hemolysis, interpret results with   - caution.   Result(s) reported on 03 Sep 2013 at 10:19AM.  Anion Gap 14    10-Dec-14 08:51, Potassium, Serum  Result Comment   POTASSIUM - MET C ORDERED DUPLICATE SEE ACCESSION#   - [id number]  Result(s) reported on 03 Sep 2013 at 09:52AM.  Potassium, Serum 6.2    10-Dec-14 08:51, Troponin I  Result Comment   TROPONIN - RESULTS VERIFIED BY REPEAT TESTING.   - HP TO JEANNIE HARRIS @1021 / 09/03/13   -  READ-BACK PROCESS PERFORMED.   Result(s) reported on 03 Sep 2013 at 10:23AM.    10-Dec-14 129:93 Basic Metabolic Panel (w/Total Calcium)  Glucose, Serum 171  BUN 61  Creatinine (comp) 11.46  Sodium, Serum 133  Potassium, Serum 5.3  Chloride, Serum 101  CO2, Serum 19  Calcium (Total), Serum 9.5  Anion Gap 13  Osmolality (calc) 288  eGFR (African American) 4  eGFR (Non-African American) 4  eGFR values <684mmin/1.73 m2 may be an indication of chronic  kidney disease (CKD).  Calculated eGFR is useful in patients with stable renal function.  The eGFR calculation will not be reliable in acutely ill patients  when serum creatinine is changing rapidly. It is not useful in   patients on dialysis. The eGFR calculation may  not be applicable  to patients at the low and high extremes of body sizes, pregnant  women, and vegetarians.    10-Dec-14 16:12, Troponin I  Result Comment   TROPONIN - RESULTS VERIFIED BY REPEAT TESTING.   - PREVIOUSLY NOTIFIED @1021  09/03/13 BY   - HP   Result(s) reported on 03 Sep 2013 at 05:20PM.    10-Dec-14 20:07, Troponin I  Result Comment   TROPONIN - RESULTS VERIFIED BY REPEAT TESTING.   - PREVIOUSLY NOTIFIED @ 1021 09/03/13   - BY HP   Result(s) reported on 03 Sep 2013 at 09:16PM.    11-Dec-14 06:29, Activated PTT  Result Comment   labs - This specimen was collected through an    - indwelling catheter or arterial line.   - A minimum of 32ms of blood was wasted prior     - to collecting the sample.  Interpret   - results with caution.   Result(s) reported on 04 Sep 2013 at 07:22AM.    11-Dec-14 001:09 Basic Metabolic Panel (w/Total Calcium)  Glucose, Serum 160  BUN 67  Creatinine (comp) 11.84  Sodium, Serum 132  Potassium, Serum 5.6  Chloride, Serum 97  CO2, Serum 23  Calcium (Total), Serum 9.6  Anion Gap 12  Osmolality (calc) 287  eGFR (African American) 4  eGFR (Non-African American) 4  eGFR values <6655mmin/1.73 m2 may be an indication of  chronic  kidney disease (CKD).  Calculated eGFR is useful in patients with stable renal function.  The eGFR calculation will not be reliable in acutely ill patients  when serum creatinine is changing rapidly. It is not useful in   patients on dialysis. The eGFR calculation may not be applicable  to patients at the low and high extremes of body sizes, pregnant  women, and vegetarians.    11-Dec-14 06:29, CBC Profile  Result Comment   labs - This specimen was collected through an    - indwelling catheter or arterial line.   - A minimum of 55m31mof blood was wasted prior     - to collecting the sample.  Interpret   - results with caution.   Result(s) reported on 04 Sep 2013 at 06:48AM.  Cardiac:    10-Dec-14 08:51, Cardiac Panel  CK, Total 486  CPK-MB, Serum 12.8  Result(s) reported on 03 Sep 2013 at 10:23AM.    10-Dec-14 08:51, Troponin I  Troponin I 12.00  0.00-0.05  0.05 ng/mL or less: NEGATIVE   Repeat testing in 3-6 hrs   if clinically indicated.  >0.05 ng/mL: POTENTIAL   MYOCARDIAL INJURY. Repeat   testing in 3-6 hrs if   clinically indicated.  NOTE: An increase or decrease   of 30% or more on serial   testing suggests a   clinically important change    10-Dec-14 16:12, CPK-MB, Serum  CPK-MB, Serum 15.0  Result(s) reported on 03 Sep 2013 at 04:51PM.    10-Dec-14 16:12, Troponin I  Troponin I 12.00  0.00-0.05  0.05 ng/mL or less: NEGATIVE   Repeat testing in 3-6 hrs   if clinically indicated.  >0.05 ng/mL: POTENTIAL   MYOCARDIAL INJURY. Repeat   testing in 3-6 hrs if   clinically indicated.  NOTE: An increase or decrease   of 30% or more on serial   testing suggests a   clinically important change    10-Dec-14 20:07, CPK-MB, Serum  CPK-MB, Serum 15.9  Result(s) reported on 03 Sep 2013 at 08:44PM.    10-Dec-14 20:07,  Troponin I  Troponin I 11.00  0.00-0.05  0.05 ng/mL or less: NEGATIVE   Repeat testing in 3-6 hrs   if clinically indicated.  >0.05 ng/mL:  POTENTIAL   MYOCARDIAL INJURY. Repeat   testing in 3-6 hrs if   clinically indicated.  NOTE: An increase or decrease   of 30% or more on serial   testing suggests a   clinically important change  Routine Coag:    10-Dec-14 08:51, Prothrombin Time  Prothrombin 15.7  INR 1.2  INR reference interval applies to patients on anticoagulant therapy.  A single INR therapeutic range for coumarins is not optimal for all  indications; however, the suggested range for most indications is  2.0 - 3.0.  Exceptions to the INR Reference Range may include: Prosthetic heart  valves, acute myocardial infarction, prevention of myocardial  infarction, and combinations of aspirin and anticoagulant. The need  for a higher or lower target INR must be assessed individually.  Reference: The Pharmacology and Management of the Vitamin K   antagonists: the seventh ACCP Conference on Antithrombotic and  Thrombolytic Therapy. CHENI.7782 Sept:126 (3suppl): N9146842.  A HCT value >55% may artifactually increase the PT.  In one study,   the increase was an average of 25%.  Reference:  "Effect on Routine and Special Coagulation Testing Values  of Citrate Anticoagulant Adjustment in Patients with High HCT Values."  American Journal of Clinical Pathology 2006;126:400-405.    10-Dec-14 22:53, Activated PTT  Activated PTT (APTT) 131.7  A HCT value >55% may artifactually increase the APTT. In one study,  the increase was an average of 19%.  Reference: "Effect on Routine and Special Coagulation Testing Values  of Citrate Anticoagulant Adjustment in Patients with High HCT Values."  American Journal of Clinical Pathology 2006;126:400-405.    11-Dec-14 06:29, Activated PTT  Activated PTT (APTT) 134.6  A HCT value >55% may artifactually increase the APTT. In one study,  the increase was an average of 19%.  Reference: "Effect on Routine and Special Coagulation Testing Values  of Citrate Anticoagulant Adjustment in Patients  with High HCT Values."  American Journal of Clinical Pathology 2006;126:400-405.  Routine Hem:    10-Dec-14 08:51, Hemogram, Platelet Count  WBC (CBC) 15.7  RBC (CBC) 3.88  Hemoglobin (CBC) 11.8  Hematocrit (CBC) 36.1  Platelet Count (CBC) 164  Result(s) reported on 03 Sep 2013 at 10:00AM.  MCV 93  MCH 30.4  MCHC 32.7  RDW 16.9    10-Dec-14 22:53, CBC Profile  WBC (CBC) 15.8  RBC (CBC) 3.81  Hemoglobin (CBC) 11.6  Hematocrit (CBC) 35.8  Platelet Count (CBC) 184  MCV 94  MCH 30.3  MCHC 32.3  RDW 17.0  Neutrophil % 85.1  Lymphocyte % 7.0  Monocyte % 7.5  Eosinophil % 0.0  Basophil % 0.4  Neutrophil # 13.5  Lymphocyte # 1.1  Monocyte # 1.2  Eosinophil # 0.0  Basophil # 0.1  Result(s) reported on 03 Sep 2013 at 11:24PM.    11-Dec-14 06:29, CBC Profile  WBC (CBC) 13.4  RBC (CBC) 3.59  Hemoglobin (CBC) 10.4  Hematocrit (CBC) 33.4  Platelet Count (CBC) 182  MCV 93  MCH 29.0  MCHC 31.2  RDW 17.0  Neutrophil % 81.8  Lymphocyte % 9.8  Monocyte % 7.5  Eosinophil % 0.0  Basophil % 0.9  Neutrophil # 11.0  Lymphocyte # 1.3  Monocyte # 1.0  Eosinophil # 0.0  Basophil # 0.1   ASSESSMENT AND PLAN:  Assessment/Admission Diagnosis 80 year old  African American male admitted with confusion and hypotension. He has ESRD on hemodialysis. He is s/p shuntogram on 12/9 with angioplasty left innominate vein and angioplasty venous anastomosis left brachial axillary AV graft. Also found to have elevated troponin and is being seen by cardiology for NSTEMI.  Left brachial axillary AV graft can be used at dilaysis. No vascular intervention recommended at this time. If issues with using graft in the future, will address.   Electronic Signatures: Su Grand (PA-C)  (Signed 11-Dec-14 10:14)  Authored: Chief Complaint and History, PAST MEDICAL/SURGICAL HISTORY, ALLERGIES, HOME MEDICATIONS, Family and Social History, Review of Systems, Physical Exam, LABS, Assessment and  Plan   Last Updated: 11-Dec-14 10:14 by Su Grand (PA-C)

## 2015-01-15 NOTE — Op Note (Signed)
PATIENT NAME:  [name], [name] MR#:  [id number] DATE OF BIRTH:  30-Apr-1935  DATE OF IMPLANTATION:  09/12/2013  PREOPERATIVE DIAGNOSIS: Junctional bradycardia with hypotension and heart rate dropping to 30.   POSTOPERATIVE DIAGNOSIS: Junctional bradycardia with hypotension and heart rate dropping to 30.    ATTENDING CARDIOLOGISTS: Dr. Mariah MillingGollan and Dr. Kirke CorinArida.   IMPLANTING PHYSICIAN: Gabriel DownsJaved Johana Hopkinson, MD.    DESCRIPTION OF PROCEDURE: The patient was taken to the operating room and the left upper chest was prepared with Hibiclens and it was draped in a sterile fashion. Next, 1% Xylocaine was infiltrated over the skin overlying the deltopectoral groove.   An incision was made on the skin overlying the deltopectoral groove. Subcutaneous tissues were dissected out deeply. No cephalic vein was isolated, so a left subclavian stick was made and the left subclavian vein was entered on the first pass. Both atrial and ventricular leads were  introduced through the one stick and through the one introducer with the technique of leaving the wire behind approach.   First the ventricular lead was positioned on the floor of the right ventricle and stimulation thresholds were found to be satisfactory. The lead was tied to the pectoralis major muscle.   Next, the atrial lead was introduced from the same site, and an optimum position was obtained at the right atrial appendage. Stimulation thresholds were found to be satisfactory on both sides.   Medtronic [id number], 58 cm, Medtronic lead was utilized for the ventricle, serial #[id number].    Atrial lead was 5592, 53 cm made by [employer], serial #[id number].  The pacemaker model was Adapta [id number] serial [id number] [name].  The leads were checked for position and thresholds were measured several times before hooking both leads to the pacemaker. All connections were tightened securely and pacemaker was put in the pocket created under the superficial fascia. All the bleeding  points were cauterized. Subcutaneous tissue and skin were sutured separately with Vicryl and silk sutures. The patient was sent to the recovery room in stable condition. The family was notified about the progress of the patient.    ____________________________ Gabriel DownsJaved Daisean Brodhead, MD jm:np D: 09/12/2013 15:16:51 ET T: 09/12/2013 23:14:06 ET JOB#: [id number]  cc: Gabriel DownsJaved Montez Cuda, MD, <Dictator> Antonieta Ibaimothy J. Gollan, MD Gabriel DownsJAVED Jolaine Fryberger MD ELECTRONICALLY SIGNED 09/24/2013 13:40

## 2015-01-15 NOTE — Op Note (Signed)
PATIENT NAME:  [name], [name] MR#:  [id number] DATE OF BIRTH:  1935-01-27  DATE OF PROCEDURE:  11/15/2012  PREOPERATIVE DIAGNOSIS:  1.  End-stage renal disease requiring hemodialysis. 2.  Thrombosis of left forearm loop graft.   POSTOPERATIVE DIAGNOSIS: 1.  End-stage renal disease requiring hemodialysis. 2.  Thrombosis of left forearm loop graft.   PROCEDURES PERFORMED: 1.  Left upper extremity shuntogram.  2.  Insertion of left IJ cuffed tunneled dialysis catheter with ultrasound and fluoroscopic guidance.   SURGEON: Renford DillsGregory G. Schnier, M.D.   SEDATION: Versed 4 mg plus fentanyl 150 mcg administered IV. Continuous ECG, pulse oximetry and cardiopulmonary monitoring is performed throughout the entire procedure by the interventional radiology nurse. Total sedation time was 1 hour.   ACCESS:  1.  6 French sheath left forearm loop graft.  2.  Left IJ.   CONTRAST USED: Isovue 5 mL.   FLUOROSCOPY TIME: 3.2 minutes.   INDICATIONS: The patient is a 80 year old gentleman who presented to dialysis earlier today and was found to have thrombosis of his forearm loop graft. Risks and benefits for thrombectomy were reviewed. All questions answered. The patient has agreed to proceed.   DESCRIPTION OF PROCEDURE: The patient is taken to special procedures and placed in the supine position. After adequate sedation is achieved, he is positioned with his left arm extended palm upward and the left arm is prepped and draped in sterile fashion. 1% lidocaine is infiltrated into the soft tissues and access to the AV loop graft is obtained with a micropuncture needle, microwire, followed micro sheath, J-wire followed by a 6 French sheath is inserted. KMP catheter and floppy Glidewire are then used to negotiate up to the axillary vein. Of importance, the patient has had multiple interventions in the past and now has covered stents extending from the forearm loop graft almost to the axillary vein.   On imaging of  the axillary vein in order to treat this area, yet another stent would be  required, which would extend the area of the stented vein into the axillary and compromise a future brachial axillary access. Obviously this is not warranted and therefore attempts of thrombectomy of the graft were abandoned. The patient's left neck was then prepped and draped in sterile fashion. Ultrasound was placed in a sterile sleeve. Ultrasound is utilized secondary to lack of appropriate landmarks and the jugular vein was identified. It was echolucent and compressible, indicating patency. Images recorded for the permanent record. Under direct ultrasound visualization after 1% lidocaine has been infiltrated, access to the jugular vein is obtained. Microwire followed by micro sheath. J-wire followed is advanced under fluoroscopic guidance into the inferior vena cava. Counterincision is made at the wire insertion site. A small pocket created with a hemostat. The catheter dilators were then passed over the wire. Catheter is then advanced through the peel-away sheath. Unfortunately, it hangs up at the confluence of the right and left innominate veins and ultimately after achieving 2 glide wires negotiated into the inferior vena cava, the catheter is slid down so that the tips are in the mid atrium. The catheter is then pulled subcutaneously and the hub assembly connected. Both lumens aspirate and flush easily. Each lumen was then packed with 5000 units of heparin per lumen. Neck counterincision is closed with 4-0 Monocryl subcuticular and Dermabond. The catheter is secured to the chest wall with 2 interrupted 0 silk suture.   The patient tolerated the procedure well. Final chest x-ray shows there is no hemopneumothorax. Catheter  is smooth in contour, free of kinks and the tips are located in the mid atrium. He will be re-evaluated in the office for further vein mapping most likely a left arm brachial axillary dialysis graft will be placed  as his new permanent access.   ____________________________ Renford Dills, MD ggs:cc D: 11/18/2012 18:03:16 ET T: 11/18/2012 22:03:27 ET JOB#: [id number]  cc: Renford Dills, MD, <Dictator> Renford Dills MD ELECTRONICALLY SIGNED 11/19/2012 11:24

## 2015-01-15 NOTE — Consult Note (Signed)
General Aspect 80 year old [place] American male with hypertension, diabetes, end-stage renal disease, on hemodialysis, mild to moderate dementia and diastolic congestive heart failure brought from home by family secondary to worsening confusion. cardiology was consult in for elevated cardiac enzymes.   baseline has dementia, but usually conversational, recognizes family. He is legally blind and uses a walker to walk around. He had a procedure to declot his dialysis shunt on his left arm yesterday. in the past, following this procedure, the patient has had confusion, has poor appetite, is weak and sleeps all day per the notes. yesterday,  his confusion got worse. He was delirious and became very talkative all night and appeared extremely weak this morning so was brought to the ER.   in the ER,He was hypotensive with blood pressure systolic in the 36R when he initially presented to the ER. He was not febrile. notes indicate he had chest pain yesterday.he received IV fluids in the emergency room, started on pressors. mild improvement in his mentation after fluids and blood pressure support. His last dialysis was Monday on 09/01/2013.   potassium of 6.2 and an elevated troponin first set of 12.   prior cardiac catheterization 01/23/2013 showing severe RCA and left circumflex disease, moderate LAD disease. Aggressive medical management recommended. If he had recurrent angina, CABG recommended as his disease was not amenable to stenting.   Present Illness . SOCIAL HISTORY: Lives at home with his wife, both are wheelchair bound, has a caregiver during the morning who usually goes after the patient sleeps in the evening. No history of any smoking or alcohol use, uses walker at baseline and is legally blind.   FAMILY HISTORY: Heart disease and diabetes runs in the family.   Physical Exam:  GEN well developed, obese, morbidly obese   HEENT hearing intact to voice   NECK supple   RESP normal resp  effort  clear BS   CARD Regular rate and rhythm  No murmur   ABD denies tenderness  soft   EXTR negative edema   SKIN normal to palpation   NEURO motor/sensory function intact   PSYCH alert, poor historian   Review of Systems:  Subjective/Chief Complaint denies having any chest pain or shortness of breath. Reports that he was feeling well at home when he was brought to the hospital   General: No Complaints   Skin: No Complaints   ENT: No Complaints   Eyes: No Complaints   Neck: No Complaints   Respiratory: No Complaints   Cardiovascular: No Complaints   Gastrointestinal: No Complaints   Genitourinary: No Complaints   Vascular: No Complaints   Musculoskeletal: No Complaints   Neurologic: No Complaints   Hematologic: No Complaints   Endocrine: No Complaints   Psychiatric: No Complaints   Review of Systems: All other systems were reviewed and found to be negative   Medications/Allergies Reviewed Medications/Allergies reviewed     blind:    Hypertension:    Renal Failure:    Diabetes:    Appendectomy:        Admit Diagnosis:   HYPOTENSION CONFUSION: Onset Date: 03-Sep-2013, Status: Active, Description: HYPOTENSION CONFUSION  Lab Results:  Hepatic:  10-Dec-14 08:51   Bilirubin, Total 0.8  Alkaline Phosphatase 60 (45-117 NOTE: New Reference Range 08/15/13)  SGPT (ALT) 28  SGOT (AST)  89  Total Protein, Serum 7.8  Albumin, Serum  3.2  Routine Chem:  10-Dec-14 08:51   Result Comment POTASSIUM - MET C ORDERED DUPLICATE SEE ACCESSION#  -  [id number]  Result(s) reported on 03 Sep 2013 at 09:52AM.  Glucose, Serum  151  BUN  58  Creatinine (comp)  11.04  Sodium, Serum  134  Potassium, Serum  6.2  Chloride, Serum 101  CO2, Serum  19  Calcium (Total), Serum 9.3  Anion Gap 14  Osmolality (calc) 287  eGFR (African American)  5  eGFR (Non-African American)  4 (eGFR values <49m/min/1.73 m2 may be an indication of chronic kidney disease  (CKD). Calculated eGFR is useful in patients with stable renal function. The eGFR calculation will not be reliable in acutely ill patients when serum creatinine is changing rapidly. It is not useful in  patients on dialysis. The eGFR calculation may not be applicable to patients at the low and high extremes of body sizes, pregnant women, and vegetarians.)  Cardiac:  10-Dec-14 08:51   Troponin I  12.00 (0.00-0.05 0.05 ng/mL or less: NEGATIVE  Repeat testing in 3-6 hrs  if clinically indicated. >0.05 ng/mL: POTENTIAL  MYOCARDIAL INJURY. Repeat  testing in 3-6 hrs if  clinically indicated. NOTE: An increase or decrease  of 30% or more on serial  testing suggests a  clinically important change)  CPK-MB, Serum  12.8 (Result(s) reported on 03 Sep 2013 at 10:23AM.)  CK, Total  486    16:12   Troponin I  12.00 (0.00-0.05 0.05 ng/mL or less: NEGATIVE  Repeat testing in 3-6 hrs  if clinically indicated. >0.05 ng/mL: POTENTIAL  MYOCARDIAL INJURY. Repeat  testing in 3-6 hrs if  clinically indicated. NOTE: An increase or decrease  of 30% or more on serial  testing suggests a  clinically important change)  Routine Coag:  10-Dec-14 08:51   Prothrombin  15.7  INR 1.2 (INR reference interval applies to patients on anticoagulant therapy. A single INR therapeutic range for coumarins is not optimal for all indications; however, the suggested range for most indications is 2.0 - 3.0. Exceptions to the INR Reference Range may include: Prosthetic heart valves, acute myocardial infarction, prevention of myocardial infarction, and combinations of aspirin and anticoagulant. The need for a higher or lower target INR must be assessed individually. Reference: The Pharmacology and Management of the Vitamin K  antagonists: the seventh ACCP Conference on Antithrombotic and Thrombolytic Therapy. CTIWPY.0998Sept:126 (3suppl): 2N9146842 A HCT value >55% may artifactually increase the PT.  In one  study,  the increase was an average of 25%. Reference:  "Effect on Routine and Special Coagulation Testing Values of Citrate Anticoagulant Adjustment in Patients with High HCT Values." American Journal of Clinical Pathology 2006;126:400-405.)  Routine Hem:  10-Dec-14 08:51   WBC (CBC)  15.7  RBC (CBC)  3.88  Hemoglobin (CBC)  11.8  Hematocrit (CBC)  36.1  Platelet Count (CBC) 164 (Result(s) reported on 03 Sep 2013 at 10:00AM.)  MCV 93  MCH 30.4  MCHC 32.7  RDW  16.9   EKG:  Interpretation EKG showing possible atrial fibrillation, unable to exclude junctional escape rhythm with ectopy in a trigeminal pattern   Radiology Results: XRay:    10-Dec-14 10:25, Chest 1 View AP or PA  Chest 1 View AP or PA   REASON FOR EXAM:    sob  COMMENTS:       PROCEDURE: DXR - DXR CHEST 1 VIEWAP OR PA  - Sep 03 2013 10:25AM     CLINICAL DATA:  Altered mental status.  Shortness of breath.    EXAM:  CHEST - 1 VIEW    COMPARISON:  One-view chest 06/18/2013.  FINDINGS:  Heart is enlarged. There is no edema or effusion to suggest failure.  Atherosclerotic calcifications are noted at the aortic arch. The  visualized soft tissues and bony thorax are unremarkable. Lung  volumes are low. No focal airspace disease is evident.     IMPRESSION:  1. Stable cardiomegaly without failure.  2. Decreased lung volumes.      Electronically Signed    By: Lawrence Santiago M.D.    On: 09/03/2013 10:45         Verified By: Resa Miner. MATTERN, M.D.,    Metoprolol: Cough  Oxycodone: Cough, Hallucinations  Ace Inhibitors: Unknown  Vital Signs/Nurse's Notes: **Vital Signs.:   10-Dec-14 18:00  Vital Signs Type Routine  Temperature Source oral  Pulse Pulse 76  Respirations Respirations 16  Systolic BP Systolic BP 299  Diastolic BP (mmHg) Diastolic BP (mmHg) 54  Mean BP 71  Pulse Ox % Pulse Ox % 94  Pulse Ox Activity Level  At rest  Oxygen Delivery 2L; Nasal Cannula  Pulse Ox Heart Rate 51     Impression 80 year old [place] American male with hypertension, diabetes, end-stage renal disease, on hemodialysis, mild to moderate dementia and diastolic congestive heart failure brought from home by family secondary to worsening confusion. cardiology was consult in for elevated cardiac enzymes.  1) non-STEMI Elevated CK-MB and troponin on arrival and on repeat 8 hours later. likely from severe hypotension in the setting of known severe CAD,  Unable at this time to exclude a primary ischemic event though he is relatively asymptomatic Poor candidate for cardiac cath lab and stenting given prior catheterization 01/23/2013 showing that his disease was not amenable to stenting per the notes. --At this time would probably recommend medical management --would recommend anticoagulation with heparin or Lovenox, statin, aspirin --unable to start beta blockers or ACE inhibitors given hypotension --echocardiogram might be helpful to exclude new regional wall motion abnormality  2) Hypotension Etiology not clear, possible septic shock, unable to exclude cardiogenic shock Improved on pressors and IV fluid resuscitation  3) end-stage renal disease Receiving hemodialysis Monday Wednesday and Friday. Followed by renal service Elevated potassium could be contributing to underlying arrhythmia  4) arrhythmia EKG not clear, concerning for junctional escape rhythm, unable to exclude atrial fibrillation Will monitor for now, would correct potassium   Electronic Signatures: Ida Rogue (MD)  (Signed 10-Dec-14 19:50)  Authored: General Aspect/Present Illness, History and Physical Exam, Review of System, Past Medical History, Health Issues, Home Medications, Labs, EKG , Radiology, Allergies, Vital Signs/Nurse's Notes, Impression/Plan   Last Updated: 10-Dec-14 19:50 by Ida Rogue (MD)

## 2015-01-15 NOTE — Consult Note (Signed)
PATIENT NAME:  [name], [name], [name]  DATE OF CONSULTATION:  09/11/2013  CONSULTING PHYSICIAN:  Gabriel SalviaSteven C. Klein, MD  Thank you very much for asking us to see [name] in consultation because of refractory bradycardia.   He is a gentleman with end-stage renal disease who presented to the hospital about a week ago with confusion. Enzymes were elevated. He was also noted to be hypotensive in the Emergency Room. The cause of that was unclear, and he was treated with antibiotics. They have now been off for about a week. He has had, unfortunately, refractory bradycardia requiring dopamine. His blood pressures had been running in the 60 to 100 range, and his heart rates, in the absence of dopamine, apparently had been running in the 30 to 40 range. ECGs have been read as atrial fibrillation, I am not sure that I do not see a P wave.   He has significant comorbidities. He has mild to moderate dementia, diastolic heart failure, hypertension and is legally blind. He is morbidly obese, and he gets around only minimally on a walker at home.   He has ischemic heart disease and had catheterization on 05/14 demonstrating severe RCA and circumflex disease with moderate LAD disease and aggressive medical management was recommended at that time.   SOCIAL HISTORY: He lives at home with his wife, both in a wheelchair. He is able to get around a little bit.   FAMILY HISTORY: Notable for hypertension and diabetes.   MEDICATION INTOLERANCES:  Include ACE INHIBITORS, METOPROLOL AND OXYCODONE.   On arrival from home, he was on low-dose carvedilol, Plavix, aspirin, atorvastatin. Currently, his medical regimen is much more limited and include aspirin, atorvastatin, clopidogrel, Florinef, Synthroid, ProAmatine 10 t.i.d., pantoprazole and intravenous dopamine.   PHYSICAL EXAMINATION: GENERAL: He is an elderly African American male appearing much older than his stated age, falling  asleep. While we talk, his heart rate is 48 and regular. His respirations are 18. His blood pressure is 65/45, having just been 107 prior to his eating.  HEENT: Notable for poor dentition.  NECK: Veins are difficult to assess. Carotids were not palpable.  LUNGS: Clear laterally.  CARDIOVASCULAR:  Heart sounds were slow, but regular and quite distant.  ABDOMEN: Soft.  EXTREMITIES: Wrapped in compression stockings and had no edema.  NEUROLOGIC: He was alert and engaging, intermittently being somnolent.   LABORATORIES: Were notable for phosphorus was 6.4. Potassium the other day was 4.5. Hemoglobin the other day was 10.2. White count was 5.7.  Electrocardiograms are read as atrial fibrillation, but appear to have sinus activity slower than his junctional escape rate. There were occasional capture beats to support the idea of a sinus rhythm.   IMPRESSION: 1.  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 2.  Coronary artery disease with diffuse two-vessel disease with previously normal left ventricular function.  3.  End-stage renal disease on dialysis, having been previously dialyzed through transcutaneous catheters on the right side and left side.  He currently dialyzes through his left arm.  4.  Hypotension on ProAmatine, aggravated by eating and probably by bradycardia.  5.  Morbid obesity.  6.  Debility.  7.  Mild/moderate dementia.   [name] has a significant constellation of issues with the bradycardia, potentially addressable with pacing. I have reviewed with the family the potential benefits, the possibility of little benefit, although I think that is low, and the potential risks including but  not limited to infection  and perforation.   The next issue within that context is how and where would it get done. Because he is so debilitated, he will need post implantation rehab; that will take probably a couple of weeks. It would be easier. I  think, for the family to do it in [place], if that is at all possible. I will review with Dr. Mariah Milling options for pacing the patient at [facility].   For right now, we continue him on the dopamine.   In the event that the patient decides not to undergo pacing, an oral trial with theophylline would be is not unreasonable.    ____________________________ Gabriel Salvia, MD sck:dmm D: 09/11/2013 13:33:35 ET T: 09/11/2013 13:53:19 ET JOB#: [id number]  cc: Gabriel Salvia, MD, <Dictator> Gabriel Salvia MD ELECTRONICALLY SIGNED 09/21/2013 9:59

## 2015-01-15 NOTE — Op Note (Signed)
PATIENT NAME:  [name], [name] MR#:  [id number] DATE OF BIRTH:  04-Jul-1935  DATE OF PROCEDURE:  04/30/2013  PREOPERATIVE DIAGNOSIS: End-stage renal disease with functional permanent dialysis access and no longer needing PermCath.   POSTOPERATIVE DIAGNOSIS: End-stage renal disease with functional permanent dialysis access and no longer needing PermCath.   PROCEDURE PERFORMED: Removal of left jugular PermCath.   SURGEON: Dino Borntreger, PA-C.  ASSISTANT: None.  ANESTHESIS: Local.   ESTIMATED BLOOD LOSS: Minimal.   INDICATION FOR THE PROCEDURE: This is a 80 year old male with end-stage renal disease. His graft is functional and he is no longer needing his PermCath; therefore this will be removed.   DESCRIPTION OF THE PROCEDURE: The patient is brought to the vascular interventional radiology area and positioned supine. The left neck and chest and existing catheter were sterilely prepped and draped and a sterile surgical field was created. The area was locally anesthetized copiously with 1% lidocaine. Hemostats were used to help dissect out the cuff. An 11 blade was used to transect the fibrous sheath connected to the cuff. The catheter was then removed in its entirety without difficulty with gentle traction. Pressure was held at the base of the neck. Sterile dressing was placed. The patient tolerated the procedure well. No complications.    ____________________________ Hoyle Sauerhelsea N. Kwinton Maahs, PA-C cnh:aw D: 04/30/2013 11:49:43 ET T: 04/30/2013 12:09:15 ET JOB#: [id number]  cc: Khamya Topp N. Hensley Treat, PA-C, <Dictator> Rishita Petron N Staysha Truby PA ELECTRONICALLY SIGNED 05/08/2013 11:59

## 2015-01-15 NOTE — Op Note (Signed)
PATIENT NAME:  [name], [name] MR#:  [id number] DATE OF BIRTH:  09-17-1935  DATE OF PROCEDURE:  02/21/2013  PREOPERATIVE DIAGNOSIS: End-stage renal disease requiring hemodialysis.   POSTOPERATIVE DIAGNOSIS:  End-stage renal disease requiring hemodialysis.   PROCEDURE PERFORMED: Left arm brachial axillary dialysis AV graft placement.   SURGEON: Renford DillsGregory G. Schnier, M.D.   ANESTHESIA: General by endotracheal intubation.   FLUIDS: Per anesthesia record.   ESTIMATED BLOOD LOSS: Minimal.   SPECIMEN: None.   INDICATIONS: Mr. 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 He is therefore undergoing placement of a brachial axillary AV graft. Risks and benefits were reviewed. All questions answered. The patient agrees to proceed.   DESCRIPTION OF PROCEDURE: The patient is taken to the operating room and placed in the supine position. After adequate general anesthesia is induced and appropriate invasive monitors are placed, he is positioned supine with his left arm extended palm upward. The left arm is prepped and draped in sterile fashion.   A linear incision is created overlying the brachial impulse approximately one fingerbreadth above the antecubital crease and the dissection is carried down through the soft tissues exposing the brachial artery. The area is tremendously inflamed and the dissection is somewhat tedious and difficult. Ultimately, approximately 2 cm segment of the brachial artery is exposed and looped with Silastic vessel loop. Attention is then turned to the axilla, where an incision is created. It is extended somewhat more proximally than usual, given the findings on venography and the dissection is carried down the confluence of the brachial veins and the basilic vein is identified and the dissection is carried more proximally. Ultimately,  tributaries as well as the more proximal axillary vein are looped with Silastic vessel loops. Gore tunneler is then used to create a tunnel subcutaneously and a 4 to 7 mm papered KeySpanore Propaten graft is pulled through the tunnel.   The brachial artery is then controlled. Arteriotomy is made, extended with Potts scissors, 6-0 Prolene stay suture is placed and an end graft to side brachial artery anastomosis is fashioned with running 6-0 Prolene. Flushing maneuvers are performed and the graft is palpated,  insuring that is a smooth contour and is going to be easily accessed.   The graft was then checked for length, irrigated with heparinized saline and clamped just above the arterial suture line. Subsequently, venotomy is made after controlling the vein with the Silastic vessel loops. Stay suture is placed with 6-0 Prolene. The graft is beveled to an appropriate shape and an end graft to side axillary vein is fashioned with running CV-6 suture. Flushing maneuvers are performed and flow is established through the graft. A soft thrill is noted, radial pulse is maintained. Both wounds are then irrigated with sterile saline and closed in layers using interrupted 3-0 Vicryl, followed by 4-0 Monocryl subcuticular and then Dermabond. The patient tolerated the procedure well. There were no immediate complications. Sponge and needle counts were correct and he was taken to the recovery area in excellent condition.   ____________________________ Renford DillsGregory G. Schnier, MD ggs:cc D: 02/26/2013 14:11:35 ET T: 02/26/2013 17:48:30 ET JOB#: [id number]  cc: Renford DillsGregory G. Schnier, MD, <Dictator> Renford DillsGREGORY G SCHNIER MD ELECTRONICALLY SIGNED 03/12/2013 16:07

## 2015-01-16 NOTE — Discharge Summary (Signed)
PATIENT NAME:  [name], [name] MR#:  [id number] DATE OF BIRTH:  04/10/1935  DATE OF ADMISSION:  09/27/2013 DATE OF DISCHARGE:  09/27/2013  REASON FOR ADMISSION:  The patient is admitted with a chief complaint of low blood pressure, low O2 saturation.   FINAL DIAGNOSES:  1.  Transient hypotension.   2.  Insulin-dependent diabetes. Watch for hypoglycemia. The patient had one episode of hypoglycemia. Insulin dose was reduced. 3.  End-stage renal disease.  4.  Mild to moderate dementia.  5.  Systolic congestive heart failure compensated.  6.  History of coronary artery disease with a recent non-elevation myocardial infarction.  7.  Sick sinus syndrome status post placement of pacemaker.  8.  Refractory hypotension on midodrine.      DISPOSITION: Back to a skilled nursing facility.   MEDICATION AT DISCHARGE:  Aspirin 81 mg daily, Plavix 75 mg daily, Dialyvite 800 mg once a day, levothyroxine 100 mcg once a day, Lipitor 10 mg daily, acetaminophen as needed for pain or fever, insulin aspart sliding scale, Seroquel 12.5 mg at bedtime, sevelamer 2400 mg 3 times a day with meals, midodrine 10 mg 3 times a day, , allopurinol 100 mg once a day, insulin aspart 70/30 8 units subcutaneously twice daily.  These are reduced from the dose from his previous 12 units. Ranitidine 150 mg twice daily.   FOLLOWUP: With Julien Nordmannimothy Gollan in two weeks.   HOSPITAL COURSE: This is a very nice gentleman, 80 year old, who has history of recent MI non-ST elevation with elevation of troponins up to 11. The patient had an echocardiogram that showed severe akinesis of the inferior wall. The patient was sent over here due to hypoxia and hypotension. The patient was asymptomatic all along.  He is obese and I think that the reason why it was recorded to be hypotensive and hypoxic, was because of body habitus rather than real event, although we called this transient hypotension as he as history of cardiogenic shock with a prolonged  hospitalization requiring pressors, now on midodrine due to severe hypotension. The patient comes today and his vitals are overall stable   ER. No significant hypotension. Normal oxygen saturation. The patient was seen by Cardiology because of new EKG changes. Cardiology seems to think that these new EKG changes are secondary to his previous MI without any significant importance. He really has very severe coronary artery disease and they recommended medical management only.  He is not a candidate for stents due to very calcified or heavy calcified vessels and he is not a candidate for bypass due to his chronic morbidities and low performance with significant obesity. The patient is ruled out for acute MI. He has a very small jump on troponin which is actually a residual from his previous ones as they went up to 11. Right now they are 0.56, 0.55, 0.51 and this is secondary to a troponin leak due to his end-stage renal disease. The patient is discharged in a really good condition without any significant change in his medications other than adding on ranitidine for GI prophylaxis.   TIME SPENT: I spent about 45 minutes with this patient earlier this morning and 35 minutes more with this patient at discharge, so 80 minutes total visit today.    ____________________________ Felipa Furnaceoberto Sanchez Gutierrez, MD rsg:dp D: 09/27/2013 13:35:45 ET T: 09/27/2013 14:09:18 ET JOB#: [id number]  cc: Felipa Furnaceoberto Sanchez Gutierrez, MD, <Dictator> Lestine Rahe Juanda ChanceSANCHEZ GUTIERRE MD ELECTRONICALLY SIGNED 10/17/2013 1:08

## 2015-01-16 NOTE — Op Note (Signed)
PATIENT NAME:  [name], [name] MR#:  [id number] DATE OF BIRTH:  [birth year]  DATE OF PROCEDURE:  09/05/2013  PREOPERATIVE DIAGNOSES: 1.  Hypotension. 2.  Mental status changes. 3.  End-stage renal disease requiring hemodialysis.  POSTOPERATIVE DIAGNOSIS:   1.  Hypotension. 2.  Mental status changes. 3.  End-stage renal disease requiring hemodialysis.  PROCEDURE PERFORMED:  Insertion of a right femoral triple lumen catheter.   PROCEDURE PERFORMED BY:  Dr. Delana Meyer.   DESCRIPTION OF PROCEDURE:  The patient is in the Intensive Care Unit. He is critically ill and currently requiring multiple parenteral medications but does not have adequate IV access. Ultrasound has been utilized to evaluate the neck and both internal jugular veins on the right and the left side are obliterated given his multiple tunneled catheters in the past. Therefore, the right groin is prepped and draped in a sterile fashion. Ultrasound is utilized. Femoral vein is compressible and echolucent, indicating patency. Images recorded, and under real-time visualization, a Seldinger needle is inserted and J-wire is advanced. There is difficulty advancing the dilator over the floppy J-wire given the patient's morbid obesity and body habitus and, therefore, a micropuncture kit is opened onto the field and the micro sheath is advanced over the wire. Amplatz Super Stiff wire is then exchanged for the J-wire and the triple-lumen catheter is advanced over the Super Stiff wire without difficulty. Wire is removed. All 3 lumens aspirate and flush easily. The catheter is then secured to the skin of the thigh with 0 silk and a sterile dressing, including a Biopatch, is applied. The patient tolerated the procedure well and there were no immediate complications.    ____________________________ Katha Cabal, MD ggs:ce D: 09/05/2013 17:55:48 ET T: 09/05/2013 18:55:37 ET JOB#: [id number]  cc: Katha Cabal, MD, <Dictator> Katha Cabal  MD ELECTRONICALLY SIGNED 09/30/2013 19:29

## 2015-01-16 NOTE — Consult Note (Signed)
General Aspect 80 year old [place] American male with severe three vessel CAD not amenable to stenting, last evaluated by cardiac cath 01/2013, hypertension, diabetes, end-stage renal disease, on hemodialysis, mild to moderate dementia and diastolic congestive heart failure brought to the hospital for possible hypoxia. Cardiology was consulted for new EKG changes, known severe CAD, chronic diastolic CHF.   baseline has dementia, but usually conversational, recognizes family. He is legally blind and uses a walker to walk around.  Previous admission 09/13/2013 with hypotension, troponin to 12, seen by cardiology. ECHO showed new inferior wall hypokineis. medical management recommended given diffuse nature of the disease.  Recent admission earlt 36/1443 for diastolic CHF, bradycardia. Pacer placed. s/p procedure to declot his dialysis shunt on his left arm.    prior cardiac catheterization 01/23/2013 showing severe RCA and left circumflex disease, moderate LAD disease. Aggressive medical management recommended.   Present Illness . SOCIAL HISTORY: Lives at home with his wife, both are wheelchair bound, has a caregiver during the morning who usually goes after the patient sleeps in the evening. No history of any smoking or alcohol use, uses walker at baseline and is legally blind.  Son cares for parents over the week-end.   FAMILY HISTORY: Heart disease and diabetes runs in the family.   Physical Exam:  GEN well developed, obese, morbidly obese   HEENT hearing intact to voice, moist oral mucosa   NECK supple   RESP normal resp effort  clear BS   CARD Regular rate and rhythm  No murmur   ABD denies tenderness  soft   LYMPH negative neck   EXTR negative edema   SKIN normal to palpation   NEURO motor/sensory function intact   PSYCH alert, poor historian   Review of Systems:  Subjective/Chief Complaint denies having any chest pain or shortness of breath.   General: No Complaints    Skin: No Complaints  Lumps   ENT: No Complaints   Eyes: No Complaints   Neck: No Complaints   Respiratory: No Complaints   Cardiovascular: No Complaints   Gastrointestinal: No Complaints   Genitourinary: No Complaints   Vascular: No Complaints   Musculoskeletal: No Complaints   Neurologic: No Complaints   Hematologic: No Complaints   Endocrine: No Complaints   Psychiatric: No Complaints   Review of Systems: All other systems were reviewed and found to be negative   ROS poor historian    Medications/Allergies Reviewed Medications/Allergies reviewed       cad:    diastolic chf:    dementia:    blind:    Hypertension:    Renal Failure:    Diabetes:    Appendectomy:          Admit Diagnosis:   ABNORMAL EKG: Onset Date: 27-Sep-2013, Status: Active, Description: ABNORMAL EKG  Home Medications:  Medication Instructions Status  allopurinol 100 mg oral tablet 1 tab(s) orally once a day (at bedtime) Active  sevelamer 2400 milligram(s) orally 3 times a day (with meals) Active  QUEtiapine 12.5 milligram(s) orally once a day (at bedtime) Active  insulin aspart-insulin aspart protamine  subcutaneous 4 times a day (before meals and at bedtime), 2 unit(s) if FSBS 151 - 200 4 unit(s) if FSBS 201 - 250 6 unit(s) if FSBS 251 - 300 8 unit(s) if FSBS 301 - 350 10 unit(s) if FSBS 351 - 400 Active  insulin aspart-insulin aspart protamine 30 units-70 units/mL subcutaneous suspension 12  subcutaneous 2 times a day Active  acetaminophen 325 mg oral tablet  2 tab(s) orally every 4 hours, As needed, pain or temp. greater than 100.4 Active  midodrine 10 mg oral tablet 1 tab(s) orally 3 times a day Active  cinacalcet 60 mg oral tablet 1 tab(s) orally every 12 hours Active  aspirin 81 mg oral tablet 1 tab(s) orally once a day (in the morning) Active  clopidogrel 75 mg oral tablet 1 tab(s) orally once a day (in the evening) Active  Dialyvite 800 Vitamin B Complex with C  and Folic Acid oral tablet 1 tab(s) orally once a day (in the morning) Active  levothyroxine 100 mcg (0.1 mg) oral tablet 1 tab(s) orally once a day (in the morning) Active  Lipitor 10 mg oral tablet 1 tab(s) orally once a day (at bedtime) Active   Lab Results:  Hepatic:  02-Jan-15 22:12   Bilirubin, Total 0.3  Alkaline Phosphatase 70 (45-117 NOTE: New Reference Range 08/15/13)  SGPT (ALT) 20  SGOT (AST) 29  Total Protein, Serum 7.5  Albumin, Serum  2.6  Routine Chem:  02-Jan-15 22:12   Glucose, Serum  139  Result Comment TROPONIN - RESULTS VERIFIED BY REPEAT TESTING.  - C/MICHELE MORTON AT 2336 09/26/13.PMH  - READ-BACK PROCESS PERFORMED.  Result(s) reported on 26 Sep 2013 at 11:01PM.  BUN  23  Creatinine (comp)  4.70  Sodium, Serum  131  Potassium, Serum 3.7  Chloride, Serum 99  CO2, Serum 27  Calcium (Total), Serum  8.4  Osmolality (calc) 269  eGFR (African American)  13  eGFR (Non-African American)  11 (eGFR values <33m/min/1.73 m2 may be an indication of chronic kidney disease (CKD). Calculated eGFR is useful in patients with stable renal function. The eGFR calculation will not be reliable in acutely ill patients when serum creatinine is changing rapidly. It is not useful in  patients on dialysis. The eGFR calculation may not be applicable to patients at the low and high extremes of body sizes, pregnant women, and vegetarians.)  Anion Gap  5  Magnesium, Serum 2.0 (1.8-2.4 THERAPEUTIC RANGE: 4-7 mg/dL TOXIC: > 10 mg/dL  -----------------------)  Phosphorus, Serum  2.2 (Result(s) reported on 26 Sep 2013 at 11:15PM.)  Cardiac:  02-Jan-15 22:12   Troponin I  0.56 (0.00-0.05 0.05 ng/mL or less: NEGATIVE  Repeat testing in 3-6 hrs  if clinically indicated. >0.05 ng/mL: POTENTIAL  MYOCARDIAL INJURY. Repeat  testing in 3-6 hrs if  clinically indicated. NOTE: An increase or decrease  of 30% or more on serial  testing suggests a  clinically important change)   CPK-MB, Serum 2.8 (Result(s) reported on 26 Sep 2013 at 11:56PM.)  03-Jan-15 02:30   Troponin I  0.55 (0.00-0.05 0.05 ng/mL or less: NEGATIVE  Repeat testing in 3-6 hrs  if clinically indicated. >0.05 ng/mL: POTENTIAL  MYOCARDIAL INJURY. Repeat  testing in 3-6 hrs if  clinically indicated. NOTE: An increase or decrease  of 30% or more on serial  testing suggests a  clinically important change)    06:07   Troponin I  0.51 (0.00-0.05 0.05 ng/mL or less: NEGATIVE  Repeat testing in 3-6 hrs  if clinically indicated. >0.05 ng/mL: POTENTIAL  MYOCARDIAL INJURY. Repeat  testing in 3-6 hrs if  clinically indicated. NOTE: An increase or decrease  of 30% or more on serial  testing suggests a  clinically important change)  Routine Coag:  02-Jan-15 22:12   Prothrombin  15.6  INR 1.3 (INR reference interval applies to patients on anticoagulant therapy. A single INR therapeutic range for coumarins is not optimal  for all indications; however, the suggested range for most indications is 2.0 - 3.0. Exceptions to the INR Reference Range may include: Prosthetic heart valves, acute myocardial infarction, prevention of myocardial infarction, and combinations of aspirin and anticoagulant. The need for a higher or lower target INR must be assessed individually. Reference: The Pharmacology and Management of the Vitamin K  antagonists: the seventh ACCP Conference on Antithrombotic and Thrombolytic Therapy. WFUXN.2355 Sept:126 (3suppl): [id number]. A HCT value >55% may artifactually increase the PT.  In one study,  the increase was an average of 25%. Reference:  "Effect on Routine and Special Coagulation Testing Values of Citrate Anticoagulant Adjustment in Patients with High HCT Values." American Journal of Clinical Pathology 2006;126:400-405.)  Routine Hem:  02-Jan-15 22:12   WBC (CBC) 8.9  RBC (CBC)  3.19  Hemoglobin (CBC)  9.7  Hematocrit (CBC)  29.9  Platelet Count (CBC) 334  MCV 94   MCH 30.3  MCHC 32.4  RDW  16.2  Neutrophil % 77.8  Lymphocyte % 14.5  Monocyte % 6.8  Eosinophil % 0.1  Basophil % 0.8  Neutrophil #  6.9  Lymphocyte # 1.3  Monocyte # 0.6  Eosinophil # 0.0  Basophil # 0.1 (Result(s) reported on 26 Sep 2013 at 11:06PM.)   EKG:  Interpretation EKG showing NSR with RBBB, IVCD, ST and T wave ABN in V4 to V6, II, III, AVF    Metoprolol: Cough  Oxycodone: Cough, Hallucinations  Ace Inhibitors: Unknown  Vital Signs/Nurse's Notes: **Vital Signs.:   03-Jan-15 05:57  Vital Signs Type Routine  Temperature Temperature (F) 98.1  Celsius 36.7  Pulse Pulse 70  Respirations Respirations 18  Systolic BP Systolic BP 732  Diastolic BP (mmHg) Diastolic BP (mmHg) 72  Mean BP 98  Pulse Ox % Pulse Ox % 96  Pulse Ox Activity Level  At rest  Oxygen Delivery Room Air/ 21 %    Impression 80 year old [place] American male with hypertension, diabetes, end-stage renal disease, on hemodialysis, mild to moderate dementia and diastolic congestive heart failure brought from home by family secondary to worsening confusion. cardiology was consult in for elevated cardiac enzymes.  1) ABN EKG Recent echo 09/16/2013 suggests inferior wall infarct, depressed EF to 30 to 35% EKG this admission showing new changes compared to prior admissions, likely will be his new baseline following infarct 09/13/13.  He is a challenge to manage as he is a poor historian with little symptoms, even on prior admissions. He has not expressed have anginal sx on last few admissions. Poor candidate for cardiac cath lab and stenting given prior catheterization 01/23/2013 showing that his disease was not amenable to stenting per the notes. On this admission, as troponin 0.5 and not trending, medical management at this time. Likely elevated from recent NSTEMI 09/13/13. Can d/c with follow up in clinic  I can discuss treatment options with Dr. Fletcher Anon who did his cardiac cath 5/14.Poor candidate for  CABG given multiple medical issues (ESRD, brain tumor, deconditioning, low EF, diabetes, etc)  2) CAD severe three vessel disease.Inferior MI Appears to have had old MI on recent echo 09/13/13, resulting severe hypotension on arrival requiring pressors.  Poor candidate for PCI. Poor candidate for CABG as above.   3) end-stage renal disease Receiving hemodialysis Monday Wednesday and Friday.  4) bradycardia: s/p pacer. not paced at this time.  5) h/o gout recently required steroids  6) Diabetes: continue insulin  7) Dementia/cognitive impairment: Continue outpt meds.   Electronic Signatures: Ida Rogue (MD)  (  Signed 03-Jan-15 12:28)  Authored: General Aspect/Present Illness, History and Physical Exam, Review of System, Past Medical History, Health Issues, Home Medications, Labs, EKG , Allergies, Vital Signs/Nurse's Notes, Impression/Plan   Last Updated: 03-Jan-15 12:28 by Ida Rogue (MD)

## 2015-01-16 NOTE — Consult Note (Signed)
   Comments   Patient in procedure and unable to see. No family here. Will try again in the AM.   Electronic Signatures: Cortni Tays, Daryl EasternJoshua R (NP)  (Signed 27-Mar-15 08:34)  Authored: Palliative Care   Last Updated: 27-Mar-15 08:34 by Malachy MoanBorders, Nycere Presley R (NP)

## 2015-01-16 NOTE — H&P (Signed)
PATIENT NAME:  [name], [name] MR#:  [id number] DATE OF BIRTH:  1935/01/12  DATE OF ADMISSION:  09/27/2013  ADDENDUM  Total time spent on history and physical and patient care 55 minutes.    ____________________________ Starleen Armsawood S. Teagon Kron, MD dse:ea D: 09/27/2013 01:29:24 ET T: 09/27/2013 02:26:43 ET JOB#: [id number]  cc: Starleen Armsawood S. Havanna Groner, MD, <Dictator> Shanora Christensen Teena IraniS Ritisha Deitrick MD ELECTRONICALLY SIGNED 09/28/2013 4:50

## 2015-01-16 NOTE — Discharge Summary (Signed)
PATIENT NAME:  [name], [name] MR#:  [id number] DATE OF BIRTH:  1934-12-30  DATE OF ADMISSION:  09/03/2013 DATE OF DISCHARGE:  09/19/2013  ADMITTING DIAGNOSIS: Confusion and hypotension.   DISCHARGE DIAGNOSES: 1.  Confusion due to acute encephalopathy as a result of hypotension and multiple other ongoing medical issues, now mental status close to baseline.  2.  Refractory hypotension possibly related to cardiogenic shock as well as the patient with hard to obtain blood pressure.  3.  Possible sick sinus syndrome status post pacemaker with junctional rhythm with bradycardia.  4.  Apneic episodes, likely sleep apnea, CPAP. Will need sleep study at some point. 5.  Non-ST myocardial infarction with history of coronary artery disease, medically managed.  6.  Hyperkalemia due to end-stage renal disease.  7.  End-stage renal disease. Dialysis Monday, Wednesday, and Friday.  8.  Diabetes.  9.  Likely chronic meningioma.  10.  Acute gout flare.  11.  Morbid obesity.  12.  Mild to moderate dementia.  13.  History of diastolic heart failure.  14.  Multiple PermCath placements.  15.  Status post arteriovenous fistula, arteriovenous shunt placement.   CONSULTANTS DURING HOSPITALIZATION: Dr. Mady Haagensen, Dr. Julien Nordmann, Dr. Levora Dredge, Dr. Corky Downs, Dr. Lamont Dowdy, Dr. Eldridge Dace.  PERTINENT LABS AND EVALUATIONS: EKG did show atrial fibrillation with slow ventricular response. Potassium was 6.2. Troponin 12. INR 1.2. WBC count was 15.7, hemoglobin 11.8, and platelet count was 164. Glucose 151. BUN was 58, creatinine 11, and sodium 134. ABG showed a pH of 7.41, pCO2 28, pO2 84, and bicarb was 17.7. CT scan of the head showed mild diffuse cortical atrophy, left frontal lobe mass noted on prior exam. Blood cultures were negative. Echocardiogram showed challenging imaging, EF 40% to 50%. Hepatitis B surface antigen and CT of the head repeated on the 15th showed no acute findings. Repeat  echocardiogram done on December 23rd showed EF 35% to 40%, global left ventricular systolic function, severe inferior wall hypokinesis, moderate increased left ventricular internal cavity area size, trivial pericardial effusion. Pertinent recent labs: Potassium was 3.9 on December 23rd.   HOSPITAL COURSE: Please refer to interim summary done by Dr. Renae Gloss dictated on the 19th. The patient is a 80 year old with end-stage renal disease who was admitted with confusion and hypotension. It was felt to be due to cardiogenic versus septic shock. The patient was treated with dopamine. He continued to require intermittent dopamine throughout the hospitalization. He was also treated for possible adrenal insufficiency with steroids for a few days without any improvement in his blood pressure. The patient was empirically treated with vancomycin and Zosyn. His blood cultures remained negative. No obvious source of infection was found. The patient was started on Florinef and he was started on Midodrine. Finally, over the last 2 to 3 days, his blood pressure has been stabilized. He is not requiring any pressors and he is doing much better. The patient was also noted to have non-ST MI with significant inferior wall hypokinesis, as described above. He was seen by cardiology. Again, he was recommended to have medical management of his coronary artery disease. He had a cardiac cath done in May 2014 which showed significant two-vessel coronary artery disease involving the RCA and left circumflex. There was moderate LAD disease and/or heavily calcified and not optimal for PCI. Therefore, he was medically treated. Also, during the hospitalization, the patient had bradycardia and due to his blood pressure not improving he underwent pacemaker placement. He has not  had any further issues with any arrhythmias. The patient also developed pain in his foot and was thought to have acute gouty flare, which was treated. He is very deconditioned  and is in need of further rehab; therefore, rehab has been arranged. At this time, he is stable for discharge.   DISCHARGE MEDICATIONS:  1.  Aspirin 81 mg 1 tab p.o. daily. 2.  Plavix 75 mg p.o. daily. 3.  Dialyvite 800 mg. 4.  Vitamin B complex 1 tab p.o. daily. 5.  Levothyroxine 100 mcg daily. 6.  Lipitor 10 mg at bedtime. 7.  Tylenol 650 mg q. 4 p.r.n.  8.  Insulin 70/30 12 units b.i.d. 9.  Insulin sliding scale. 10.  Quetiapine 12.5 mg at bedtime. 11. Sevelamer 2400 mg t.i.d. with meals. 12.  Midodrine 10 mg 1 tab p.o. t.i.d.  13.  Senna cinacalcet 600 mg 1 tablet p.o. 12. 14.  Protonix 40 mg daily. 15.  Allopurinol 100 mg at bedtime. 16.  Prednisone taper start at 60 mg taper x 10 mg until complete.   DISCHARGE ACTIVITY: PT and OT evaluation and treatment.   ACTIVITY: As tolerated.  DISCHARGE DIET: Renal as well as carbohydrate-consistent.  DISCHARGE INSTRUCTIONS AND FOLLOWUP: Continue hemodialysis ass previously doing. Follow up with Dr. Kirke CorinArida or Mariah MillingGollan in 2 to 4 weeks. Also, the patient instructed to have CPAP at bedtime. He needs to have a sleep study arranged. Also, his blood pressure needs to be checked in the right lower extremity, which is more accurate due to severe calcifications in other areas of body. He also needs to have his blood sugars followed closely.  TIME SPENT ON DISCHARGE: 45 minutes. ____________________________ Lacie ScottsShreyang H. Allena KatzPatel, MD shp:sb D: 09/19/2013 10:22:16 ET T: 09/19/2013 10:45:35 ET JOB#: [id number]  cc: Mako Pelfrey H. Allena KatzPatel, MD, <Dictator> Charise CarwinSHREYANG H Kessie Croston MD ELECTRONICALLY SIGNED 09/28/2013 8:31

## 2015-01-16 NOTE — Op Note (Signed)
PATIENT NAME:  Gabriel OreCLARK, Kimm H MR#:  [id number] DATE OF BIRTH:  02-06-1935  DATE OF PROCEDURE:  11/06/2013  PREOPERATIVE DIAGNOSES: Lack of appropriate intravenous access.   POSTOPERATIVE DIAGNOSIS: Lack of appropriate intravenous access.    PROCEDURE PERFORMED: Insertion of right femoral triple-lumen catheter.   SURGEON: Renford DillsGregory G. Schnier, M.D.   DESCRIPTION OF PROCEDURE: The patient is in his hospital bed. He is positioned supine. The right groin is prepped and draped in a sterile fashion. A full Betadine prep is used initially and then ChloraPrep is used x 2. Ultrasound is placed in a sterile sleeve. Femoral vein is identified. It is echolucent and compressible, indicating patency. Image is recorded for the permanent record. Micropuncture needle is inserted into the femoral vein. Single pass is made. Microwire is easily advanced, followed by the microsheath, J-wire, followed by the dilator. Triple-lumen catheter is advanced over the wire. All 3 lumens aspirate and flush easily. The catheter is secured to the skin of the thigh with 2-0 silk, and a sterile dressing is applied. The patient tolerated the procedure well.   ____________________________ Renford DillsGregory G. Schnier, MD ggs:gb D: 11/06/2013 20:46:32 ET T: 11/07/2013 04:09:39 ET JOB#: [id number]  cc: Renford DillsGregory G. Schnier, MD, <Dictator> Renford DillsGREGORY G SCHNIER MD ELECTRONICALLY SIGNED 11/11/2013 14:26

## 2015-01-16 NOTE — Consult Note (Signed)
PATIENT NAME:  [name], [name] MR#:  [id number] DATE OF BIRTH:  06/18/1935  DATE OF CONSULTATION:  12/18/2013  REFERRING PHYSICIAN:  Dr. Alberteen Spindle. CONSULTING PHYSICIAN:  Katharina Caper, MD  REASON FOR CONSULTATION: Requested by Dr. Alberteen Spindle in regards to perioperative management of the patient who is about to undergo left transmetatarsal amputation due to gangrene of toes.  HISTORY OF PRESENT ILLNESS: The patient is a 80 year old African American male with past medical history significant for history of severe coronary artery disease who had cardiac catheterization in May 2014 and was felt that he had significant disease which is not amenable to PCI or coronary artery bypass graft, comes to the hospital for planned transmetatarsal amputation of left foot and hospitalist services were contacted for admission. The patient himself denies any chest pains are present or shortness of breath and denies any chest pains whenever he ambulates. He tells me that he is able to ambulate at home with no significant problems. He does not use any walker or cane, according to him. However, the patient is demented and I am not sure if this history is reliable. He was noted to be somewhat hypotensive here in the hospital with systolic blood pressure of 90/49. His O2 sats are 99% on 2 liters of oxygen through nasal cannula. His wife is not available at this time to discuss issues about risks of surgery itself. The patient himself tells me that he did not have any chance to discuss with Dr. Kirke Corin his risks for surgery, but he told me that Dr. Kirke Corin talked to his wife about this in the recent past.   PAST MEDICAL HISTORY: Significant for history of recent hospitalization in February 2015. At that time, the patient was admitted for sepsis, Streptococcus viridans bacteremia, acute respiratory failure, and pneumonia. He also had transfusion of 2 units packed red blood cells. He was noted to be intermittently hypoglycemic, and he was  discharged on the 17th of February 2015 with amoxicillin clavulanate oral therapy for approximately a week after discharge from the hospital. During this hospitalization he was seen by Dr. Kirke Corin and was felt that the patient has severe coronary artery disease, also mild elevation of troponin, questionable non-Q-wave MI, but no further interventions were recommended just medical management by Dr. Kirke Corin.   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 Ejection fraction was 45% in the past, however, echocardiogram done in February 2015 revealed ejection fraction of 35% to 40%. It is difficult to know at present if this discrepancy in ejection fraction is just because of uncertainty due to echocardiogram evaluation itself. Last cardiac catheterization was done in May 2014. The patient also has history of left frontal lobe meningioma, dementia, sleep apnea, legally blind, peripheral vascular disease, hypertension, and diabetes mellitus. He had a hemoglobin A1c that was checked and was found to be 5.7 in February 2015. History of end-stage renal disease, hemodialysis Mondays, Wednesdays, and Fridays via left upper extremity AV graft. History of bradycardia status post pacemaker placement December 2014. History of gout, chronic anemia status post recent transfusion in February 2015. History of hyperlipidemia and hypothyroidism.   MEDICATIONS: According to medical records, the patient is on 325 mg 2 tablets every 4 hours as needed, allopurinol 100 mg p.o. at bedtime, aspirin 81 mg p.o. daily, atorvastatin 10 mg p.o. at bedtime, Plavix 75 mg p.o. daily, DiaVite vitamins once daily, Humulin 70/30 12 units twice daily, insulin sliding scale, levothyroxine 100 mcg  p.o. daily, lisinopril 10 mg p.o. daily, midodrine 10 mg 3 times daily, pantoprazole 40 mg p.o. daily, Seroquel 12.5 mg p.o. at bedtime, Renvela 800 mg 3 tablets 3 times daily, Sensipar 60  mg p.o. every 12 hours.   PAST SURGICAL HISTORY: Significant for multiple PermCath placements as well as AV fistula, and AV shunt aortic graft placements.   ALLERGIES: ACE INHIBITOR, METOPROLOL AS WELL AS OXYCODONE, BUT APPARENTLY THE PATIENT IS TOLERATING ONE OF THE ACE INHIBITORS.   SOCIAL HISTORY: The patient lives at home with his wife. Was in rehab in the past. No smoking or alcohol abuse. He is legally blind.   FAMILY HISTORY: Heart disease in the family as well as diabetes in the family.   REVIEW OF SYSTEMS: CONSTITUTIONAL: The patient denies any chest pains. Admits of having some shortness of breath intermittently on exertion. Otherwise, admits of walking. Denies any fevers or chills, fatigue, weakness or pains at present however admits of having left foot pains intermittently. Denies any vision problems, except as mentioned above.  ENT: Denies any sinus pain, dentures, difficulty swallowing. RESPIRATORY: Denies any cough, wheezes, asthma, or COPD. CARDIOVASCULAR: Denies any arrhythmias, palpitations or syncope.  GASTROINTESTINAL: Denies any nausea, vomiting, diarrhea, abdominal pain.  GENITOURINARY: Denies any dysuria. Admits of having some urinary output every 3 or 4 weeks.  ENDOCRINOLOGY: No polydipsia, nocturia. Admits to thyroid problems. No heat or cold intolerance or thirst.  HEMATOLOGIC: Denies any anemia, easy bruising, bleeding or swollen glands.  SKIN: Denies any acne, rashes, lesions or change in moles.  MUSCULOSKELETAL: Denies arthritis, cramps, swelling. NEUROLOGIC: Denies numbness, epilepsy or tremors.   PSYCHIATRIC: Denies anxiety or insomnia.  PHYSICAL EXAMINATION: VITAL SIGNS: On arrival to the hospital, temperature is 97, pulse was 93, respiration rate 16, blood pressure 90/49, and saturation was 99% on 3 liters of oxygen through nasal cannula.  GENERAL: This is a well-developed, well-nourished, obese African American male lying in the bed. He is somewhat  somnolent however able talk to me. His speech is somewhat slurred. He has very poor dentition. HEENT: His pupils are equal, poorly reactive to light. Extraocular movements intact. No icterus or conjunctivitis. Has normal hearing. No pharyngeal erythema. Mucosa is moist.  NECK: No masses. Supple and nontender. Thyroid not enlarged. No adenopathy, JVD or carotid bruits bilateral. Full range of motion. LUNGS: Clear to auscultation. No diminished breath sounds at bases. No rales, rhonchi, or wheezes were heard. No dullness to percussion. No overt respiratory distress.  HEART: S1, S2 appreciated. No murmurs, rubs or gallops were noted. Rhythm was regular. PMI not lateralized. Chest is nontender to palpation. 1+ pedal pulse on the right, diminished pedal pulse on the left. Left foot is in a dressing. I am not able to see his toes at present. The patient does have a sore, kind of like a carbuncle, sitting on the anterior area of the ankle on the right, which is very tender to palpation, very likely infection site carbuncle after procedure in the past.  He does have multiple right arm scarring due to AV fistulas. In the past, he did have a few AV grafts on the left. One is nonfunctional. The other one, in the left upper extremity on proximal humerus, is functional. Bruit is good as well as thrill. Good peripheral pulses, radialis pulses bilateral.  ABDOMEN: Soft, nontender. Bowel sounds are present. No organomegaly or masses were noted.  RECTAL: Deferred.  MUSCLE STRENGTH: Able to move all extremities. Not able to sit up due to significant  weakness. Not able to assess his gait. SKIN: Did not reveal any rashes apart from whatever I mentioned to you before. He does have what looks like a carbuncle in anterior ankle area on the right which is very tender to palpation, but no pus is expressed. The patient has dressing on his left foot and I am not able to see that area. Otherwise, skin was warm and dry to palpation.   LYMPHATIC: No adenopathy in the cervical region.  NEUROLOGIC: Cranial nerves grossly intact. Sensory is intact. The patient does have some dysarthric speech. No aphasia. The patient is alert, oriented to person and place, cooperative. Memory is poor. No anxiety or depression noted.   DIAGNOSTIC DATA: Labs are not taken since no IV access is available at this time yet. The patient's EKG is not yet done and chest x-ray is not yet performed.   ASSESSMENT AND PLAN: 1.  Perioperative evaluation. The patient is at high risk for surgery. We will get palliative care involved to explain high risks of surgery including death. According to the patient, however, the patient's wife talked to Dr. Kirke Corin about surgical risks. For now we will continue medical therapy, but Plavix is on hold for possible procedure.  2.  Severe coronary artery disease, as above. We will resume Plavix after operation if operation is decided on, as soon as it is safe to do it after surgery. According to Dr. Alberteen Spindle, we will continue home medications. Apparently, the patient did have a chance to talk to Dr. Kirke Corin about the risks and he was agreeable for those risks. 3.  End-stage renal disease, left upper extremity graft. Get nephrologist involved with hemodialysis Mondays, Wednesdays, and Fridays. Will get chest x-ray to evaluate for fluid retention as well. 4.  Hypotension, possibly unreliable peripheral measurements. We will continue midodrine for now.  5.  Peripheral vascular disease with left foot gangrene. Transmetatarsal amputation is planned by podiatry. The risks are discussed with the patient. I am waiting for the patient's wife however to come here. The patient himself is agreeable despite the risks. 6.  Chronic systolic diastolic congestive heart failure. Seems to be stable. Hemodialysis per schedule. Nephrology consultation will be obtained. The patient admits of minimal urinary output every 3 to 4 weeks.  7.  Dementia. Supportive  therapy. 8.  Obstructive sleep apnea. We will initiate CPAP or oxygen if needed at bedtime.  9.  Diabetes mellitus. We will resume home medications. However, the patient's hemoglobin A1c was only 5.7 in February. I am concerned about hypoglycemic episodes which were present during his stay in February as well, so we will see how his glucose levels look like, but I may need to hold his long-acting insulin for now and just continue sliding scale insulin. 10.  Hypothyroidism. TSH was 3.73 in February 2015. We will continue Synthroid, if the patient was on that medication since I do not remember now reading that medication.  11.  Other problems. No IV access. Central line will be placed by vascular surgery.  Thanks for the consult. Will follow along.  TIME SPENT: One hour.   ____________________________ Katharina Caper, MD rv:sb D: 12/18/2013 15:12:13 ET T: 12/18/2013 16:24:09 ET JOB#: [id number]  cc: Katharina Caper, MD, <Dictator> Kinta Martis MD ELECTRONICALLY SIGNED 12/30/2013 21:23

## 2015-01-16 NOTE — Consult Note (Signed)
   Comments   Pt in dialysis. Will follow  Electronic Signatures: Graylin Sperling Z (NP)  (Signed 11-Feb-15 10:54)  Authored: Palliative Care   Last Updated: 11-Feb-15 10:54 by Pearletha FurlGusler, Erle Guster Z (NP)

## 2015-01-16 NOTE — Op Note (Signed)
PATIENT NAME:  [name], Kayshaun H MR#:  [id number] DATE OF BIRTH:  [birth year]  DATE OF PROCEDURE:  12/18/2013  PREOPERATIVE DIAGNOSIS:  Osteomyelitis, left foot.   POSTOPERATIVE DIAGNOSIS:  Osteomyelitis, left foot.  PROCEDURES:  1. Ultrasound guidance for vascular access to the right basilic vein.  2. Fluoroscopic guidance for placement of catheter.  3. Insertion of single lumen peripherally inserted central venous catheter, right arm.  SURGEON: Levora DredgeGregory Schnier, M.D.  ANESTHESIA: Local.   ESTIMATED BLOOD LOSS: Minimal.   INDICATION FOR PROCEDURE:  Requiring IV antibiotics for greater than 5 days.  DESCRIPTION OF PROCEDURE: The patient's right arm was sterilely prepped and draped, and a sterile surgical field was created. The basilic vein was accessed under direct ultrasound guidance without difficulty with a micropuncture needle and permanent image was recorded. 0.018 wire was then placed into the superior vena cava. Peel-away sheath was placed over the wire. A single lumen peripherally inserted central venous catheter was then placed over the wire and the wire and peel-away sheath were removed. The catheter tip was placed into the superior vena cava and was secured at the skin at 38 cm with a sterile dressing. The catheter withdrew blood well and flushed easily with heparinized saline. The patient tolerated procedure well.    ____________________________ Renford DillsGregory G. Schnier, MD ggs:dmm D: 12/19/2013 15:51:00 ET T: 12/19/2013 21:48:54 ET JOB#: [id number]  cc: Renford DillsGregory G. Schnier, MD, <Dictator> Renford DillsGREGORY G SCHNIER MD ELECTRONICALLY SIGNED 01/13/2014 11:13

## 2015-01-16 NOTE — Consult Note (Signed)
   Comments   Palliative Care consult ordered, came by to see pt and pt in procedure. Will followup   Electronic Signatures: Pearletha FurlGusler, Raylei Losurdo Z (NP)  (Signed 09-Feb-15 14:56)  Authored: Palliative Care   Last Updated: 09-Feb-15 14:56 by Pearletha FurlGusler, Francheska Villeda Z (NP)

## 2015-01-16 NOTE — H&P (Signed)
PATIENT NAME:  [name], [name] MR#:  [id number] DATE OF BIRTH:  01-Nov-1934  DATE OF ADMISSION:  09/03/2013  ADMITTING PHYSICIAN:  Gladstone Lighter, MD  PRIMARY CARE PHYSICIAN: [facility]  PRIMARY NEPHROLOGIST:  Dr. Tresea Mall  CHIEF COMPLAINT: Confusion and low blood pressure.   HISTORY OF PRESENT ILLNESS: Mr. Petrosino is a 80 year old African American male with past medical history significant for hypertension, diabetes, end-stage renal disease, on Monday, Wednesday, Friday hemodialysis, mild to moderate dementia and diastolic congestive heart failure brought from home by family secondary to worsening confusion. The patient at baseline has dementia, but usually conversational, recognizes family. He is legally blind and uses a walker to walk around. He had a procedure to declot his dialysis shunt on his left arm yesterday. Usually after this procedure, the patient gets more confused, has poor appetite, is weak and sleeps all day according to family. But yesterday, apparently, his confusion got worse. He was delirious and became very talkative all night and appeared extremely weak this morning so was brought to the ER. He was hypotensive with blood pressure systolic in the 01U when he initially presented to the ER. He was not febrile. He had complaint of chest pain yesterday to the caregiver but denies any pain at this time. His mental status is improving after he received two 500 mL fluid boluses and now his blood pressure is improving. He is placed on 10 mcg of dopamine at this time. Denies any nausea, vomiting. The patient is due for dialysis today but because he was brought to the hospital, did not get dialyzed. His last dialysis was Monday on 09/01/2013.  His labs here show potassium of 6.2 and an elevated troponin first set of 12.  So he is being admitted for the same.   PAST MEDICAL HISTORY: 1.  Hypertension.  2.  Insulin-dependent diabetes mellitus.  3.  End-stage renal disease on  Monday, Wednesday, Friday hemodialysis.  4.  Mild to moderate dementia.  5.  Diastolic congestive heart failure.  6.  Coronary artery disease with medical management recommended.   PAST SURGICAL HISTORY:  1.  Multiple PermCath placements. 2.  AV fistula, AV shunt placement.   ALLERGIES TO MEDICATIONS: 1.  ACE INHIBITORS.  2.  METOPROLOL.  3.  OXYCODONE.   HOME MEDICATIONS: 1.  Aspirin 81 mg p.o. daily.  2.  Atorvastatin 80 mg p.o. daily.  3.  Coreg 6.25 mg p.o. b.i.d.  4.  Plavix 75 mg p.o. daily.  5.  Dialyvite 1 tablet p.o. daily.  6.  Allopurinol 100 mg daily 7.  Lasix 120 mg in the morning and 80 mg in the evening.  8.  Humulin insulin 70/30 60 units every a.m. and 25 units every p.m. 9.  Hydralazine 50 mg p.o. 3 times a day.  10.  Regular insulin 10 units with each meal. 11.  Sliding scale insulin.  12.  Levothyroxine 100 mcg p.o. daily.  13.  Lisinopril 10 mg p.o. daily.  14.  Melatonin 5 mg by mouth daily.  15.  Sublingual nitroglycerin p.r.n. for chest pain.  16. Sevelamer 800 mg tablet, 4 tablets with meals 3 times a day and 1 tablet b.i.d. with snacks.  17.  Sensipar 120 mg every evening.   SOCIAL HISTORY: Lives at home with his wife, both are wheelchair bound, has a caregiver during the morning who usually goes after the patient sleeps in the evening. No history of any smoking or alcohol use, uses walker at baseline  and is legally blind.   FAMILY HISTORY: Heart disease and diabetes runs in the family.   REVIEW OF SYSTEMS:  CONSTITUTIONAL: No fever. Positive for fatigue and weakness.  EYES: The patient is legally blind in both eyes, except sees a little bit of color, but not able to read or identify anybody. No glaucoma or cataracts.  ENT: No tinnitus, ear pain, hearing loss, epistaxis or discharge. Positive for snoring. RESPIRATORY:  Dry cough noticed yesterday. No wheezing, hemoptysis or COPD.  CARDIOVASCULAR: Complaining of chest pain yesterday, none now.  Positive for orthopnea. No dyspnea on exertion, arrhythmia, palpitations or syncope.  GASTROINTESTINAL: No nausea, vomiting, diarrhea, abdominal pain, hematemesis or melena. Positive for history of constipation.  GENITOURINARY: The patient is oliguric due to dialysis patient. ENDOCRINE: No polyuria, nocturia, thyroid problems, heat or cold intolerance.  HEMATOLOGY: No anemia, easy bruising or bleeding.  SKIN: No acne, rash or lesions.  MUSCULOSKELETAL:  No arthritis or gout.  NEUROLOGIC: No history of CVA, TIA or seizures. PSYCHOLOGIC:  No anxiety, insomnia, depression.   PHYSICAL EXAMINATION: VITAL SIGNS: Temperature 97.6 degrees Fahrenheit, pulse 62, respirations 16, blood pressure 54/20 when he came in. Pulse ox was 91% on room air.  GENERAL: Heavily built, well-nourished male lying in bed, not in any acute distress.  HEENT: Normocephalic, atraumatic. Positive for the cornea on the right eye and round-looking pupil in the left eye with sluggish reaction to light. The extraocular movements are intact. Anicteric sclerae.  Oropharynx is clear without erythema, mass or exudates.  NECK: Supple. No thyromegaly, JVD or carotid bruits. No lymphadenopathy.  LUNGS: Moving air bilaterally. Decreased bibasilar breath sounds. No wheeze or crackles. No use of accessory muscles for breathing.  CARDIOVASCULAR: S1, S2 regular rate and rhythm, 3/6 systolic murmur heard. No rubs or gallops.  ABDOMEN: Obese, soft, nontender, nondistended. No hepatosplenomegaly. Normal bowel sounds.  EXTREMITIES: No pedal edema. No clubbing or cyanosis. Feeble dorsalis pedis pulses palpable bilaterally. Left upper arm shunt present with a suture in place. No erythema, tenderness or warmth to touch noticed.  SKIN: No acne, rash or lesions.  LYMPHATICS: No cervical lymphadenopathy.  NEUROLOGIC: Cranial nerves seem to be grossly intact. No focal motor or sensory deficits.  PSYCHOLOGICAL: The patient is awake, confused at times but  oriented x 2.   LABORATORY DATA: WBC is 15.7, hemoglobin 11.8, hematocrit 36.1, platelet count 164.   Sodium 134, potassium 6.2, chloride 101, bicarb 19, BUN 58, creatinine 11.04, glucose 159, calcium of 9.3.   ALT 28, AST 89, alk phos 60, total bili 0.8 and albumin of 3.2.   First CK is 486. CK-MB 12.8 and troponin of 12. INR is 1.2. ABG showing pH of 7.41, pCO2 28, pO2 of 84, bicarb of 17.7 and sats of 96% on 2 liters.  Lactic acid slightly elevated at 1.4.   Chest x-ray showing stable cardiomegaly without failure or decreased lung volumes and CT of the head showing left frontal lobe mass which is stable as seen on the prior CT head as well.  Mild diffuse cortical atrophy is present.  EKG showing sinus bradycardia. No acute ST-T wave elevations.   ASSESSMENT AND PLAN: A 80 year old male with history of dementia, end-stage renal disease on hemodialysis, hypertension, diabetes and diastolic congestive heart failure, brought from home secondary to weakness, worsening confusion and delirium after his declotting procedure for left arm dialysis shunt done yesterday. The patient is noted to be hypotensive, requiring fluid boluses. 1.  Hypotension. Either cardiogenic versus septic shock, more likely  cardiogenic. Continue dopamine drip. No source of infection identified. Left arm appears normal with no erythema or tenderness. Blood cultures have been drawn and empirically started on vancomycin and Zosyn at this time. He already received 2 fluid boluses and is improving at this time. Hold further IV fluids because he is a dialysis patient. 2.  Non-ST segment elevation myocardial infarction with last troponin a couple of months ago is completely normal, now elevated up to 12.  CK and CK-MB are also elevated. The patient had a cardiac catheterization done in 01/2013 by Dr. Cindee Lame, which showed diffuse 2-vessel disease in left circumflex and also right coronary artery and medical management was recommended at  the time versus bypass if symptoms persists. Dr. Rockey Situ has been consulted. Echocardiogram has been ordered. We will hold off on heparin drip until cardiology sees the patient. Continue aspirin and Plavix for now. Hold Coreg, lisinopril due to hypotension. Continue statin. The patient denies any chest pain at this time. 3.  Worsening confusion on top of underlying dementia. The patient seems to be improving likely secondary to hypoperfusion of the brain set from hypotension. Continue to monitor. The patient does have a history of sundowning during the evenings.  4.  Hypertension. Hold medications as patient is hypotensive. 5.  Hyperkalemia. Due for dialysis today. Give insulin dextrose and Kayexalate has been ordered. Repeat BMP later today. Dialysis probably will not be done today because of his acute myocardial infarct at this time. Note, the patient is hypotensive. Continue to monitor.  6.  End-stage renal disease. On Monday, Wednesday, Friday hemodialysis. The patient is due for dialysis today. We will wait until nephrology sees the patient. Dr. Holley Raring has been notified.  7.  Diabetes mellitus. The patient has poor p.o. intake. Also we will just place on sliding scale insulin for now and hold his insulin. 8.  Gastrointestinal and deep venous thrombosis prophylaxis.  CODE STATUS:  Full code.  Total critical care time spent on this patient is 65 minutes.    ____________________________ Gladstone Lighter, MD rk:ce D: 09/03/2013 12:58:24 ET T: 09/03/2013 14:32:43 ET JOB#: [id number]  cc: Gladstone Lighter, MD, <Dictator> Munsoor Lilian Kapur, MD Gladstone Lighter MD ELECTRONICALLY SIGNED 10/07/2013 14:55

## 2015-01-16 NOTE — Op Note (Signed)
PATIENT NAME:  [name], [name] MR#:  [id number] DATE OF BIRTH:  11-25-1934  DATE OF PROCEDURE:  09/02/2013  PREOPERATIVE DIAGNOSES:  1. Complication of left brachial axillary dialysis graft with inability to access the graft.  2. Pseudoaneurysm of left brachial axillary dialysis graft in the midportion of the graft.   POSTOPERATIVE DIAGNOSIS:  1. Complication of left brachial axillary dialysis graft with inability to access the graft.  2. Pseudoaneurysm of left brachial axillary dialysis graft in the midportion of the graft.  3. 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  4. Stenosis of the venous anastomosis of the arteriovenous graft, left brachial axillary, greater than 80%.   PROCEDURES PERFORMED:  1. Contrast injection, left arm brachial axillary dialysis graft.  2. Percutaneous transluminal angioplasty of the left innominate vein to 14 mm.  3. Percutaneous transluminal angioplasty of the venous anastomosis, left brachial axillary dialysis graft, to 8 mm.  4. Ultrasound-guided percutaneous thrombin injection for treatment of pseudoaneurysm.   PROCEDURE PERFORMED BY: Renford DillsGregory G. Demarius Archila, MD  SEDATION: Versed 5 mg plus fentanyl 200 mcg administered IV. Continuous ECG, pulse oximetry and cardiopulmonary monitoring is performed throughout the entire procedure by the interventional radiology nurse. Total sedation time was approximately 1 hour 50 minutes.   ACCESS:  1. A 6 French sheath, antegrade direction, left arm brachial axillary dialysis graft.  2. A 5 French micro sheath, left arm pseudoaneurysm.   CONTRAST USED: Isovue 55 mL.   FLUOROSCOPY TIME: 6.0 minutes.   INDICATIONS: [name] is a 80 year old gentleman who is sent by dialysis secondary to increased swelling of the arm and inability to access the left arm brachial axillary dialysis graft. Noninvasive workup demonstrated abnormal flows at the venous anastomosis as well as in the central veins. Also  noted was a very large 2.5 cm pseudoaneurysm emanating from the midportion of the graft, but communicating with the cephalic vein. Risks and benefits for angiography and intervention were reviewed. All questions answered. The patient agrees to proceed.   PROCEDURE: The patient is taken to special procedures and placed in the supine position. After adequate sedation is achieved, he is positioned supine with his left arm extended palm upward. The left arm is prepped and draped in a sterile fashion. Appropriate timeout is called.   Lidocaine 1% is infiltrated in the soft tissues overlying the palpable graft near the arterial anastomosis, and in an antegrade direction, a micropuncture needle is inserted, microwire followed by micro sheath, J-wire followed by a 6 French sheath.   Hand injection of contrast is then used to demonstrate the AV graft. There is immediate and rapid filling of a large pseudoaneurysm from the apex of the graft, and this then fills the cephalic vein as well. There appears to be stenosis of the venous anastomosis. There are extensive collaterals noted within the axillary and subclavian veins and poor filling of the innominate.   Glidewire and a 65 Kumpe catheter are then advanced into the subclavian vein, where magnified images of the central veins are obtained, demonstrating greater than 90% stenosis at 2 locations within the innominate vein. The first is at the confluence of the subclavian and jugular veins, and this is very narrowed. The vein is somewhat sclerotic throughout its course, and then at a confluence of the 2 innominate veins forming the superior vena cava, there is another napkin-like ring lesion noted as well.   Heparin 3000 units is given, and a Magic torque wire is negotiated with the Kumpe  catheter through the stenosis of the innominate vein and then down into the inferior vena cava, allowing for good purchase. Kumpe is removed. Initially, an 8 x 6 Dorado balloon is  utilized to angioplasty both locations. Inflations are for approximately 1 minute, atmospheres are approximately 16 to 18. A 12 x 6 Armada balloon is advanced across the lesions. Each one requires its own angioplasty, each for a minute. Subsequently, a 14 x 6 balloon, this is an Armada balloon, is advanced across the lesions and individually inflated. Followup angiography after each inflation demonstrated significant improvement. Following the 14 mm inflation, there appears to be minimal residual stenosis, and now there is almost no filling of the previously noted collaterals.   The 8 mm balloon is then reintroduced, and angioplasty of the venous anastomosis is then performed to 16 atmospheres for 1 full minute. Followup angiography demonstrates resolution of this lesion. The balloon is then repositioned to the apex of the graft. It is then inflated to 10 atmospheres. A micropuncture needle is inserted under ultrasound guidance into the pseudoaneurysm. Hand injection of contrast verifies catheter position in the pseudoaneurysm. This is the micro sheath on which I have placed a stopcock. There is no filling of the AV graft with the balloon inflated, and therefore, approximately 2 mL of thrombin is injected into the pseudoaneurysm. After approximately 1 more minute, the 8 mm balloon is deflated. Hand injection of contrast through the 6 French sheath demonstrates uniform rapid flow of contrast through the AV brachial axillary graft, with no longer any filling of the pseudoaneurysm. Duplex ultrasound with color flow demonstrates thrombus and no flow within the pseudoaneurysm.   With the 8 mm balloon inflated, the hand injection of contrast was utilized to demonstrate the arterial portion of the graft as well as the visualized portions of the brachial artery.   Pursestring sutures are placed around each sheath. The sheaths are removed, light pressure is held, and there are no immediate complications.    INTERPRETATION: Initial views of the arteriovenous graft demonstrate the graft is actually patent. It is in relatively good condition; however, there is a 2-1/2 to 3 cm pseudoaneurysm emanating from the graft off the apex and filling the cephalic vein. There is a narrowing at the venous anastomosis, and there is subtotal occlusion of the left innominate vein with extensive collaterals.   Following angioplasty to a maximum of 14 mm, the innominate is now widely patent, and there is minimal filling of the collaterals previously noted. Following angioplasty to 8 mm, there is adequate treatment of the venous outflow at the anastomosis, and following thrombin injection, the pseudoaneurysm has been eliminated.   SUMMARY: Successful salvage of the left arm brachial axillary dialysis graft with treatment of the innominate vein centrally, the venous anastomosis and the pseudoaneurysm.    ____________________________ Renford Dills, MD ggs:lb D: 09/02/2013 11:31:43 ET T: 09/02/2013 12:01:05 ET JOB#: [id number]  cc: Renford Dills, MD, <Dictator> Renford Dills MD ELECTRONICALLY SIGNED 09/30/2013 19:27

## 2015-01-16 NOTE — H&P (Signed)
PATIENT NAME:  [name] MR#:  [id number] DATE OF BIRTH:  1934/12/25  DATE OF ADMISSION:  09/27/2013  REFERRING PHYSICIAN: Dr. Delman Kitten.  PRIMARY CARE PHYSICIAN: [facility].  PRIMARY NEPHROLOGIST: Dr. Anthonette Legato.   CARDIOLOGY: Dr. Rockey Situ.   CHIEF COMPLAINT: Sent from nursing home from low blood pressure and low O2 saturation.   HISTORY OF PRESENT ILLNESS: This is a 80 year old male who was recently discharged from [facility]. It was a lengthy hospital stay, more significant for non-ST-elevated myocardial infarction managed medically, as well, sick sinus syndrome, status post pacemaker insertion, with known history of refractory hypertension on Midodrine, as well known, is hard to obtain blood pressure at baseline. The patient was recently discharged from [facility] went to subacute rehab. During rounds, they were checking vital signs on the patient. They could not take O2 saturation, when he presented, was sleeping, as well, they could not obtain blood pressure, so they sent him to ED.  In the ED, the patient's blood pressure was within normal limits. As well, his O2 sat is hard to pick up which is known by the history, but had ABG done where ABG did show normal O2 sat at 97.5 on room air. The patient did not have any complaints as any shortness of breath, any altered mental status, any confusion, any chest pain. The patient had basic work-up done in ED which does not show any significant lab abnormalities beside elevated troponin at 0.56, but patient is known to have low blood pressure at baseline, as well hard to check blood pressure,  at baseline. He did have troponin 11 few days ago status post ST elevated myocardial infarction. The patient had EKG done which did show a T-wave depression in the lateral leads which is new, so hospitalist service was requested to admit the patient for further evaluation and to cycle his cardiac enzymes. The patient reports he had his  hemodialysis on Friday without any problems.   PAST MEDICAL HISTORY: 1.  Known history of insulin-dependent diabetes mellitus.  2.  End-stage renal disease on hemodialysis Monday, Wednesday, Friday.  3.  Mild to moderate dementia,  4.  Systolic CHF.  5.  History of coronary artery disease with recent non-STEMI.  6.  Sick sinus syndrome, status post pacemaker insertion.  7.  History of refractory hypotension on midodrine.    PAST SURGICAL HISTORY: 1.  Multiple Perm-a-cath placement.  2.  AV fistula/ AV shunt placement.   ALLERGIES: ACE INHIBITOR, METOPROLOL,OXYCODONE.   HOME MEDICATIONS:  1.  Tylenol as needed. 2.  Aspirin 81 mg daily. 3.  Insulin as per rate sliding scale.  4.  Insulin 30/70, 12 subcutaneous units 2 times a day.  5.  Allopurinol 100 mg oral daily.  6.  Lipitor 10 mg daily.  7.  Plavix 75 mg daily.  8.  Quetiapine 12.5 mg oral 3 times a day.  9.  Cinacalcet 60 mg oral every 12 hours.  10.  Sevelamer 2400 mg 3 times a day.  11.  Levothyroxine 100 mcg oral daily.  12.  Multivitamin 1 tablet oral daily.   SOCIAL HISTORY: The patient lives at home with his wife, currently is in subacute rehab. No smoking. No alcohol use. The patient is legally blind.   FAMILY HISTORY: Significant for heart disease and diabetes in the family.   REVIEW OF SYSTEMS:  CONSTITUTIONAL: The patient denies fever, chills, fatigue, weakness, weight gain, weight loss.  EYES: Denies any history of glaucoma or cataracts. The  patient is legally blind.  ENT: Denies tinnitus, ear pain, hearing loss, epistaxis.  RESPIRATORY: Denies cough, wheezing, hemoptysis, dyspnea, COPD.   CARDIOVASCULAR: Denies chest pain, orthopnea, edema, palpitations, syncope.   GASTROINTESTINAL: Denies nausea, vomiting, diarrhea, abdominal pain, hematemesis, melena, jaundice.  GENITOURINARY: Denies dysuria, hematuria.  ENDOCRINE: Denies polyuria, polydipsia, heat or cold intolerance.  HEMATOLOGY: Denies anemia, easy  bruising, bleeding diathesis.  INTEGUMENT: Denies acne, rash or skin lesions.  MUSCULOSKELETAL: Denies any back pain or cramps. Reports some activity with a walker.  Reports history of gout.  NEUROLOGIC: Denies CVA, TIA,  headache, tremors. PSYCHIATRIC: Denies anxiety, insomnia or depression.   PHYSICAL EXAMINATION: VITAL SIGNS: Temperature 97.6, pulse 71, respiratory rate 18, blood pressure 113/66 saturating, pulse ox cannot pick up O2 sats, but he is 97% on ABG.  GENERAL: Well-nourished obese male, looks comfortable in bed, in no apparent distress.  HEENT: Normocephalic, atraumatic, positive for the cornea in the right eye and round looking pupil on the left with a sluggish reaction to the right. Extraocular muscles intact. Anicteric sclerae. Moist oral mucosa. No oral lesions.  NECK: Supple. No thyromegaly. No JVD. No carotid bruits. No lymphadenopathy.  LUNGS: The patient has good air entry bilaterally. No wheezing, rales, rhonchi.  CARDIOVASCULAR: S1, S2 heard. Has mild systolic murmur. No rubs. No gallops.  ABDOMEN: Soft, nontender, nondistended. Bowel sounds present.  EXTREMITIES: No edema. No clubbing. No cyanosis. Pedal pulses felt bilaterally. The patient has left upper arm shunt. No erythema, no tenderness on site.  SKIN: No acne. No rash.  LYMPHATIC: No cervical lymphadenopathy.   NEUROLOGIC: Grossly intact. No focal motor or sensory deficits. PSYCHIATRIC: The patient is awake, alert x 2.  PERTINENT LABS:  Glucose 139, BUN 23, creatinine 4.7, sodium 131, potassium 3.7, chloride 99, CO2 27. Troponin 0.56, CK-MB 2.8, ALT 20, AST 29, alk phos 70. White blood cell 8.9, hemoglobin 9.7, hematocrit 29.9, platelets 334, INR 1.3. ABG showing pH of 7.55, pCO2 of 31, pO2 of 137 and O2 sat 97.5 on room air.   EKG showing normal sinus rhythm at 76 beats per minute with ST depression in leads V4, V5, V6 and aVL, which appears to be new.   ASSESSMENT AND PLAN: 1.  New EKG changes. The patient  is asymptomatic, denies any chest pain, any shortness of breath. Given 324 of aspirin in the ED. He has cardiac history, so he will be admitted to telemetry unit. Will continue to cycle his cardiac enzymes. Will consult cardiology, Dr. Rockey Situ, the patient's cardiologist, for further evaluation. The patient is on optimal medication for his coronary artery disease. He is on aspirin, Plavix, statin and he cannot be on any beta blockers due to his low blood pressure, or he cannot be on any ACE inhibitor due to allergy. He cannot be on ARBS due to low blood pressure as well.  2.  Low oxygen saturation and hypotension at nursing home. Currently, they are within normal limits. The patient is known to be hard to check blood pressure at baseline, as well, the oxygen saturation does not pick up,  but his saturation is normal on ABG.  3.  End-stage renal disease. We will consult nephrology service in case the patient will stay long enough where he will need dialysis on Monday.  4.  Diabetes mellitus: Will have the patient on insulin sliding scale and at lower dose insulin 70/30.  5.  Gout. Continue with allopurinol.  6.  Obstructive sleep apnea: Continue with CPAP at bedtime.  7.  Sick sinus syndrome, patient is status post permanent pacemaker.  8.  CODE STATUS: Discussed with the son and patient, patient is full code.  9.  Deep vein thrombosis prophylaxis. Subcutaneous heparin.    total time spent on admission and patient care 55 minutes.  ____________________________ Albertine Patricia, MD dse:NTS D: 09/27/2013 01:28:50 ET T: 09/27/2013 02:24:47 ET JOB#: [id number]  cc: Albertine Patricia, MD, <Dictator> Rawlin Reaume Graciela Husbands MD ELECTRONICALLY SIGNED 09/28/2013 4:52

## 2015-01-16 NOTE — Consult Note (Signed)
Brief Consult Note: Diagnosis: preoperative evaluation, cad, severe, nonamenable by PCI or CABG, medical management only was recommended by cardiology, ESRD, HD 1/3/5, PVD with L foot toe gangrene, pain, chronci systolic diastolic CHf, cardiomyoapathy, EF 35-40% on Echo Febr. 2015, HTn, DM, dementia, s/p pacemaker, gout, Hyperlpidemia, hypothyroidism.   Consult note dictated.   Recommend further assessment or treatment.   Orders entered.   Discussed with Attending MD.   Comments: 1. Preoperative evaluation, pt is high risk for surgery, will get palliative acre involved to explain  high risks of surgery, including death, according to patient, his wife talked with DRArida about surgical risks, fo now will contine medical management, plavix is on hold for procedure 2. Severe CAD, resume plavix after operation if operation is decided on as soon as if is safe to do it according to surgery, contineu home meds, pt had change to talk to DR Kirke CorinArida about risks, per pt 3. ESRD, LUE graft , get nephrology involved, HD 1/3/5, getting chest xray to evaluate fluid retention 4. hypotesnion, possibly unreliable peripheral measurements, contuinue midodrine 5. PVD with L foot gangrene, transmethatarsal amputation is planed by podiatry, risks are discussed with family and patient, agreeble despite risks 6. chronci systolic diastolic CHF, seems to be stable, HD per schedulle, nephrology consulted, minimal urinary output every 3-4 weeks 7. dementia, supporitive therapy,  8. OSA, CPAP vs O2 prn 9. DM, resume home meds, hgb a1c 5.7 in febr 2015, 10 hypothyroidism, TSH 3.73 in febr, 15 11 no IV access, central line placement requested either by vascular or surgery Thanks fro consult, we'll follow along.  Electronic Signatures: Katharina CaperVaickute, Canio Winokur (MD)  (Signed 26-Mar-15 15:12)  Authored: Brief Consult Note   Last Updated: 26-Mar-15 15:12 by Katharina CaperVaickute, Shanecia Hoganson (MD)

## 2015-01-16 NOTE — Discharge Summary (Signed)
PATIENT NAME:  [name], [name]     PRINCIPAL DIAGNOSIS: Peripheral vascular disease with gangrenous changes, left forefoot.   SECONDARY DIAGNOSES:  Congestive heart failure, coronary artery disease, diabetes mellitus, hypertension, end-stage renal disease, dialysis Monday, Wednesday, Friday.   PROCEDURES: Peripherally inserted central catheter line placement 12/18/2013.   CONSULTS:  1.  [facility] for medical management and medical history and physical.  2.  [facility].  3.  Dr. Thedore MinsSingh nephrology, dialysis management.  4.  Palliative care.   HOSPITAL COURSE: The patient was admitted on 03/26 in preparation for a transmetatarsal amputation of the left foot. There was some mix-up in communication as the patient was understood to suppose to have a vascular procedure performed on Thursday. The patient was unable to have this performed. On 03/27, discussion was made with Dr. Henrene HawkingKephart in anesthesia who felt that  surgery at this point would be at a very high risk. The same was obtained through evaluation with Dr. Wyn Quakerew  and with Dr. Juliene PinaMody. This was discussed with the family that the patient would be at high risk for failure of his amputation to heal, which may require a higher up amputation, which could potentially be life threatening. Family agreed with the decision to hold off on his surgery at this point. The patient's foot was stable on physical examination with dry gangrenous changes. No drainage or signs of overt infection. Blood work on admission showed a white count of 8.3 stable with no clear evidence of left shift. The patient was stabilized and will have dialysis performed on 03/27 prior to his discharge, and we will plan for discharge back to [facility] on the same medications he was admitted with. At this point, we will plan for a vascular procedure to be  performed on Monday with Dr. Wyn Quakerew. The patient is discharged guarded condition.   DISCHARGE INSTRUCTIONS:   Betadine gauze dressing changes every 2-3 days to be performed at [facility].  Resume all home medications from the facility. He will followup outpatient in the clinic in about 4 weeks. At this point, we will just monitor.    ____________________________ Linus Galasodd Sonyia Muro, DPM tc:tc D: [name] 13:41:51 ET T: [name] 13:53:43 ET JOB#: [id number]  cc: Linus Galasodd Jeanann Balinski, DPM, <Dictator> Dayelin Balducci DPM ELECTRONICALLY SIGNED 01/13/2014 11:35

## 2015-01-16 NOTE — Consult Note (Signed)
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 Dr. Cleda Mccreedy has been monitoring his left foot gangrene as an outpatient, and it has been stable. Dr. Lucky Cowboy is planning angiogram with intervention to optimize arterial perfusion.  Patient reports minimal pain this morning. No fever or chills. No other complaints. He is resting comfortably.   PAST MEDICAL/SURGICAL HISTORY:  Past Medical History:   cad:    diastolic chf:    dementia:    blind:    Hypertension:    Renal Failure:    Diabetes:    AV access for hemodialysis:    Appendectomy:   ALLERGIES:  Allergies:  Metoprolol: Cough  Oxycodone: Cough, Hallucinations  Ace Inhibitors: Unknown  HOME MEDICATIONS:  Home Medications: Medication Instructions Status  allopurinol 100 mg oral tablet 1 tab(s) orally once a day (at bedtime) Active  midodrine 10 mg oral tablet 1 tab(s) orally 3 times a day Active  levothyroxine 100 mcg (0.1 mg) oral tablet 1 tab(s) orally once a day Active  lisinopril 10 mg oral tablet 1 tab(s) orally once a day Active  Aspirin Enteric Coated 81 mg oral delayed release tablet 1 tab(s) orally once a day Active  atorvastatin 10 mg oral tablet 1 tab(s) orally once a day (at bedtime) Active  pantoprazole 40 mg oral delayed release tablet 1 tab(s) orally once a day Active  Renvela carbonate 800 mg oral tablet 3 tab(s) orally 3 times a day (with meals) and 1 tablet with snacks Active  HumuLIN 70/30 human recombinant 70 units-30 units/mL subcutaneous suspension 12 unit(s) subcutaneous 2 times a day Active  Sensipar 60 mg oral tablet 1 tab(s) orally every 12 hours Active  insulin regular human recombinant 100 units/mL injectable solution  subcutaneous , As Needed per sliding  scale: if BS 151-200 = 1 unit if BS 201-250 = 2 units if BS 251-300 = 3 units if BS 301-350 = 4 units Active  clopidogrel 75 mg oral tablet 1 tab(s) orally once a day (in the evening) Active  Dialyvite 800 Vitamin B Complex with C and Folic Acid oral tablet 1 tab(s) orally once a day (in the morning) Active  acetaminophen 325 mg oral tablet 2 tab(s) orally every 4 hours, As Needed - for Pain Active  QUEtiapine 25 mg oral tablet 0.5 tab orally once a day (at bedtime) Active   Family and Social History:  Family History Coronary Artery Disease  Diabetes Mellitus   Social History negative ETOH, negative Illicit drugs   Review of Systems:  Fever/Chills No   Cough No   Abdominal Pain No   Nausea/Vomiting No   SOB/DOE No   Physical Exam:  GEN no acute distress, obese   HEENT hearing intact to voice   NECK supple  No masses   RESP normal resp effort  clear BS   CARD regular rate   VASCULAR ACCESS Left arm +bruit   ABD denies tenderness  soft   EXTR Left foot gangrene dressed; right scab anterior shin   NEURO follows commands, blind   PSYCH alert, A+O to time, place, person   LABS:  Laboratory Results: Routine Chem:    26-Mar-15 38:88, Basic Metabolic Panel (w/Total Calcium)  Glucose, Serum 80  BUN 17  Creatinine (comp) 4.71  Sodium, Serum 137  Potassium, Serum 2.7  Chloride, Serum 101  CO2, Serum 29  Calcium (Total), Serum 8.3  Anion Gap 7  Osmolality (calc) 274  eGFR (African American) 13  eGFR (Non-African American) 11  eGFR values <97m/min/1.73 m2 may be an indication of chronic  kidney disease (CKD).  Calculated eGFR is useful in patients with stable renal function.  The eGFR calculation will not be reliable in acutely ill patients  when serum creatinine is changing rapidly. It is not useful in   patients on dialysis. The eGFR calculation may not be applicable  to patients at the low and high extremes of body sizes, pregnant  women, and  vegetarians.    26-Mar-15 18:00, Magnesium, Serum  Magnesium, Serum 1.9  1.8-2.4  THERAPEUTIC RANGE: 4-7 mg/dL  TOXIC: > 10 mg/dL   -----------------------  Routine Hem:    26-Mar-15 18:01, CBC Profile  WBC (CBC) 8.3  RBC (CBC) 3.66  Hemoglobin (CBC) 10.1  Hematocrit (CBC) 32.3  Platelet Count (CBC) 315  MCV 88  MCH 27.7  MCHC 31.4  RDW 22.2  Neutrophil % 71.9  Lymphocyte % 18.5  Monocyte % 9.0  Eosinophil % 0.0  Basophil % 0.6  Neutrophil # 6.0  Lymphocyte # 1.5  Monocyte # 0.7  Eosinophil # 0.0  Basophil # 0.0  Result(s) reported on 18 Dec 2013 at 06:29PM.   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 Dr. CCleda Mccreedyhas been monitoring his left foot gangrene as an outpatient, and it has been stable. Dr. DLucky Cowboyis planning angiogram with intervention to optimize arterial perfusion on Monday. Dr. CCleda Mccreedyplanning for possible TMA today.   Electronic Signatures: HSu Grand(PA-C)  (Signed 27-Mar-15 08:57)  Authored: Chief Complaint and History, PAST MEDICAL/SURGICAL HISTORY, ALLERGIES, HOME MEDICATIONS, Family and Social History, Review of Systems, Physical Exam, LABS, Assessment and Plan   Last Updated: 27-Mar-15 08:57 by HSu Grand(PA-C)

## 2015-01-16 NOTE — Consult Note (Signed)
PATIENT NAME:  [name], Makael H MR#:  [id number] DATE OF BIRTH:  May 19, 1935  DATE OF CONSULTATION:  11/04/2013  REFERRING PHYSICIAN:  Delfino LovettVipul Shah, MD CONSULTING PHYSICIAN:  Stann Mainlandavid P. Sampson GoonFitzgerald, MD  REASON FOR CONSULTATION: Viridans strep and Coag-negative Staphylococcus bacteremia as well as gangrene.   HISTORY OF PRESENT ILLNESS: This is a 80 year old African American male with history of end-stage renal disease, hypertension, diabetes, CHF, and peripheral vascular disease who was admitted February 1st with altered mental status. He was hypotensive with blood pressure in the 70s. He was also anemic with a hemoglobin 6.2. The patient was found to have gangrene and possible pneumonia. He was started on antibiotics including vancomycin, Zosyn, and azithromycin. The patient had blood cultures done and grew 1 in 2 bottles with Coag-negative staph and viridans strep. Repeat blood cultures have been negative. The patient so far has been seen by podiatry and vascular surgery. He has continued on dialysis. He underwent revascularization on February 9th by Dr. Wyn Quakerew.   Currently, the patient is relatively confused. History is obtained from his chart.   PAST MEDICAL HISTORY: 1.  End-stage renal disease.  2.  Diabetes.  3.  Hypertension.  4.  CHF.  5.  Gangrene as above.   PAST SURGICAL HISTORY: PermCath placement and AV fistula surgery.   SOCIAL HISTORY: The patient has been living with his wife. Both are wheelchair bound. He does have a caregiver. No tobacco, alcohol, or drugs. He is legally blind.   FAMILY HISTORY: Heart disease and diabetes, according to previous records.  REVIEW OF SYSTEMS: Unable to be obtained due to patient confusion.   ALLERGIES: ACE INHIBITORS, LOPRESSOR, AND OXYCODONE.  CURRENT ANTIBIOTICS:  Since admission include azithromycin from February 1st through February 8th, Zosyn from February 1st to February 8th, vancomycin 1000 mg dosing at dialysis since February 1st.  PHYSICAL  EXAMINATION: VITAL SIGNS: T-max 97.5. No fevers have been noted from admission. Pulse is 79, blood pressure 92/60, respirations 18, and sat is 97% on room air.  GENERAL: He is obese, lying in bed. He is quite confused. Sclerae are anicteric and mildly pale. Oropharynx is clear.  HEART: Regular.  LUNGS: Clear to auscultation.  ABDOMEN: Obese, soft, nontender.  VASCULAR: He has a right IJ in place as well as an AV graft in his left upper extremity. On his lower extremities he has dry gangrene of his left foot, from the forefoot forward with very dark skin and cool to touch. There is no drainage or odor.  NEUROLOGIC: He is confused, not sure where he is. He is talking asking me if this car is mine. I have a hard time understanding him.  LABORATORY AND DIAGNOSTICS: Blood cultures reviewed. On January 2nd, the patient was admitted as well and has negative blood cultures. On February 1st, the patient grew from 1 of 2 bottles Staphylococcus capitis and Streptococcus viridans. The Staphylococcus capitis was sensitive to clindamycin, ciprofloxacin, gentamicin, erythromycin, vancomycin, and levofloxacin. Other blood culture on that day was negative and followup on February 8th was negative. White blood count February 7th was 11.4; the day of admission it was 10.7. Hemoglobin February 7th was 9.7, platelet count 362. Renal function is consistent with end-stage renal disease.  Echocardiogram February 8th shows it is a poor study. EF is 35% to 40%. No evidence of endocarditis, although valves are somewhat dysfunctional with moderate TR.  X-ray of his left foot showed no acute bony changes. There was cellulitis over the great toe potentially.   IMPRESSION:  A 80 year old with end-stage renal disease on hemodialysis as well as peripheral vascular disease status post revascularization yesterday, but with dry gangrene of his left foot. He is also growing viridans Streptococcus and Staphylococcus capitis from 1 of 2  blood cultures. Followup blood cultures are negative. He has been treated with Vanco, Zosyn, and azithromycin.   RECOMMENDATIONS: At this point, I think his main issue will be his gangrene and will need to tailor antibiotics towards that depending on what podiatry decides regarding amputation. I think the Staphylococcus capitis and Streptococcus viridans are both contaminant as they only grew in 1 of 2 blood cultures and the followup cultures were negative. I do not think they are the etiology of his sepsis. Regardless, at this point, I would continue the patient on vancomycin dosing at dialysis to treat the gangrene. Depending on further surgical interventions, we could place him on other antibiotics as well with oral Augmentin to treat any gram-negatives and anaerobes. Thank you for the consult. I will be glad to follow with you.   ____________________________ Stann Mainland. Sampson Goon, MD dpf:sb D: 11/04/2013 08:54:47 ET T: 11/04/2013 09:28:43 ET JOB#: [id number]  cc: Stann Mainland. Sampson Goon, MD, <Dictator> DAVID Sampson Goon MD ELECTRONICALLY SIGNED 11/05/2013 20:50

## 2015-01-16 NOTE — H&P (Signed)
PATIENT NAME:  [name], [name] MR#:  [id number] DATE OF BIRTH:  Jul 24, 1935  DATE OF ADMISSION:  10/26/2013  PRIMARY CARE PHYSICIAN: Nonlocal.   REFERRING PHYSICIAN: Dr. Fanny Bien.    CHIEF COMPLAINT: Chest pain today.   HISTORY OF PRESENT ILLNESS: A 80 year old African American male with a history of ESRD, hypertension, diabetes, CHF. Was sent to ED due to chest pain. The patient is confused, demented, unable to provide any information. The patient's family member is not at the bedside. According to Dr. Fanny Bien, the patient's daughter-in-law said the patient had developed chest discomfort and was weak today, so the patient was sent to ED for further evaluation. The patient was noted to have a low blood pressure at 70s to 80s. Was treated with normal saline without improvement. In addition, the patient's hemoglobin is 6.2. Troponin 0.17. Dr. Fanny Bien ordered 2 units of PRBC transfusion.   PAST MEDICAL HISTORY:  1. ESRD on hemodialysis on Monday, Wednesday, Friday.  2. Systolic CHF.  3. Diabetes.  4. CAD.  5. Sick sinus syndrome with a pacemaker.  6. History of hypotension, on midodrine.    PAST SURGICAL HISTORY: PermCath placement, AV fistula surgery.   SOCIAL HISTORY: Lives at home with his wife. Both are wheelchair bound. Has a caregiver during the morning. No history of smoking or drinking or illicit drugs. The patient is legally blind. This is according to previous document.    FAMILY HISTORY: Heart disease and diabetes according to previous document.   REVIEW OF SYSTEMS Unable to obtain at this time.   ALLERGIES: ACE INHIBITOR LOPRESSOR, OXYCODONE.   HOME MEDICATIONS:  1. Sevelamer 800 mg 3 tablets p.o. t.i.d.  2. Ranitidine 150 mg p.o. b.i.d.  3. Quetiapine 25 mg 1/2 tablet once a day at bedtime.  4. Amiodarone 10 mg p.o. t.i.d.  5. Lipitor 10 mg p.o. at bedtime.  6. Levothyroxine 100 mcg p.o. daily.  7. Insulin 70/30 8 units b.i.d.  8. Insulin sliding scale.  9. Dialyvite 800.  10.  Vitamin B complex with C and folic acid 1 tablet once a day.  11. Plavix 75 mg p.o. daily.  12. Cinacalcet 60 mg p.o. q.12 hours.  13. Aspirin 81 mg p.o. daily.  14. Allopurinol 100 mg p.o. at bedtime.  15. Acetaminophen 325 mg 2 tablets every 4 hours p.r.n.   PHYSICAL EXAMINATION:  VITAL SIGNS: Temperature 97.9, blood pressure 84/37, pulse 50, O2 saturation 96% on oxygen by nasal cannula.  GENERAL: The patient is confused, demented, unable to communicate, in no acute distress.  HEENT: The patient's right eye is blind. Left eye pupil is round, no reaction to light. No discharge from ears or nose.  NECK: Supple. No JVD or carotid bruits. No lymphadenopathy. No thyromegaly.  CARDIOVASCULAR: S1, S2, regular rate and rhythm. No murmurs, gallops.  PULMONARY: Bilateral air entry. No wheezing or rales. No use of accessory muscles to breathe.  ABDOMEN: Obese. Bowel sounds present. No distention or tenderness. Difficult to estimate whether the patient has organomegaly.  EXTREMITIES: No edema, clubbing or cyanosis but has right toe gangrene. Difficult to estimate whether the patient has pedal pulses.  SKIN: No rash or jaundice.  NEUROLOGIC: The patient is confused, demented. Does not follow commands. Unable to examine.   LABORATORY DATA: BNP 81,192. WBC 10.7, hemoglobin 6.2, platelets 305. Glucose 135, BUN 24, creatinine 6.61, sodium 137, potassium 3.3, chloride 103. Troponin 0.17. TSH 3.73. INR 1.2. Chest x-ray: Abnormal obscuration of left hemidiaphragm and retrocardiac density. Could be from pneumonia,  mass, pleural effusion. Consider CT for further characterization. ABG showed pH of 7.5, pCO2 of 35, pO2 of 60, with FiO2 of 21%. Lactic acid 0.8.   IMPRESSIONS:  1. Hypotension.  2. Anemia.  3. Possible pneumonia.  4. Hypokalemia.  5. End-stage renal disease.  6. Coronary artery disease.  7. Congestive heart failure.  8. Dementia.  9. Right toe gangrene.   PLAN OF TREATMENT:  1. The patient  will be admitted to stepdown. We will give PRBC 2 unit transfusion. Follow up hemoglobin. The patient's stool occult was negative in the ED but we will repeat.  2. For hypotension, we will continue midodrine. Since the patient's blood pressure has been low at 70s to 80s for several hours, we will start Levophed.  3. For possible pneumonia, the patient was treated with vancomycin and Zosyn in the ED. We will continue, pharmacy to dose.  4. We will follow up blood culture, sputum culture.  5. We will get a nephrology consult and continue hemodialysis.  6. Since there is no evidence of GI bleeding, we will continue aspirin, Plavix.  7. For right toe gangrene, we will request a podiatry consult.  8. According to the patient's daughter-in-law, the patient's wife wanted the patient FULL CODE.   TIME SPENT: About 65 minutes.   ____________________________ Shaune PollackQing Jakari Jacot, MD qc:gb D: 10/26/2013 23:06:59 ET T: 10/27/2013 04:02:37 ET JOB#: [id number]  cc: Shaune PollackQing Nera Haworth, MD, <Dictator> Shaune PollackQING Ayriana Wix MD ELECTRONICALLY SIGNED 10/28/2013 14:28

## 2015-01-16 NOTE — Consult Note (Signed)
PATIENT NAME:  [name], [name] MR#:  [id number] DATE OF BIRTH:  [birth year]  DATE OF CONSULTATION:  10/27/2013  CONSULTING PHYSICIAN:  Linus Galas, DPM  REASON FOR CONSULTATION: This is a 80 year old male seen on consultation for some gangrenous toes on his left foot. The patient's family first noticed these starting a few weeks ago. The family states that the patient said he thought he was having a gout attack in his foot although he has never had gout. State that the nurses at the nursing home told him it was just some bleeding into the skin from his circulation. Denies any injury or drainage.   PAST MEDICAL HISTORY: End-stage renal disease on hemodialysis Monday, Wednesday, Friday, congestive heart failure, diabetes, coronary artery disease, sick sinus syndrome with pacemaker, history of hypotension.   PAST SURGICAL HISTORY: Multiple AV fistula surgery, PermCath placement. Pacemaker placement last month.   HOME MEDICATIONS: 1. Sevelamer 800 mg 3 tablets p.o. t.i.d.  2. Ranitidine 150 mg p.o. b.i.d.  3. Quetiapine 25 mg 1/2 tablet once daily at bedtime.  4. Amiodarone 10 mg p.o. t.i.d.  5. Lipitor 10 mg p.o. at bedtime.  6. Levothyroxine 100 mcg p.o. daily.  7. Insulin 70/30 8 units twice a day insulin sliding scale. 8. Dialyvite 800 mg.  9. Vitamin B complex with C and folic acid 1 tablet once a day.  10. Plavix 75 mg p.o. daily. 11. Cinacalcet 60 mg p.o. q.12 hours.   12. Aspirin 81 mg p.o. daily.  13. Allopurinol 100 mg p.o. at bedtime. 14. Acetaminophen 325 mg 2 tablets every four hours.   ALLERGIES: ACE INHIBITOR, LOPRESSOR. OXYCODONE.   FAMILY HISTORY: Heart disease and diabetes.   REVIEW OF SYSTEMS: Denies any specific fever or chills. Does have some swelling and discoloration, especially into the left foot. Does not specifically relate any numbness or paresthesias. Currently on dialysis. He is blind. Remainder of his review of systems is not really obtainable.   PHYSICAL  EXAMINATION: VASCULAR: DP and PT pulses are trace on the right, not clearly palpable on the left. Capillary filling time is delayed to all of the toes on the left.  NEUROLOGICAL: There is loss of protective threshold with a monofilament wire bilateral. Proprioception is impaired.  INTEGUMENT: Skin is dry and atrophic bilateral. There is some edema and hyperpigmentation and discoloration in the left forefoot. Gangrenous changes, which are dry to the entire left hallux and distal aspect of the left fourth toe. Some mild cyanotic changes to the tips of the remainder of the toes. The toes are cool to the touch.  MUSCULOSKELETAL: Range of motion and muscle testing is deferred at this point.   X-RAYS: Three views were taken earlier today of the right foot which revealed multiple calcified blood vessels throughout the foot. No evidence of any osteomyelitis. It is noted that this is the right foot as was dictated in his admission note and his gangrenous changes on his left. We will order x-rays of his left foot.   ASSESSMENT:  1. Peripheral vascular disease with associated gangrenous changes left first and fourth toes.  2. Diabetes with associated neuropathy.   PLAN: We will obtain an x-ray of his left foot for evaluation. We will await results from vascular surgery for evaluation. He will most likely need some type of intervention and amputation, either transmetatarsal or higher up. He is at high risk of a below-knee amputation. At this point the toes are dry, so they may remain uncovered. We will follow him  accordingly after he gets stabilized and vascular assessment.  ____________________________ Linus Galasodd Kelbie Moro, DPM tc:sg D: 10/27/2013 13:30:53 ET T: 10/27/2013 13:55:49 ET JOB#: [id number]  cc: Linus Galasodd Sakshi Sermons, DPM, <Dictator> Jerimy Johanson DPM ELECTRONICALLY SIGNED 11/18/2013 9:59

## 2015-01-16 NOTE — Op Note (Signed)
PATIENT NAME:  [name], [name] MR#:  [id number] DATE OF BIRTH:  08/05/1935  DATE OF PROCEDURE:  10/26/2013  PREOPERATIVE DIAGNOSES: 1.  Hypotension.  2.  End-stage renal disease requiring hemodialysis.   POSTOPERATIVE DIAGNOSES: 1.  Hypotension.  2.  End-stage renal disease requiring hemodialysis.   PROCEDURE PERFORMED: Insertion of left femoral triple-lumen catheter.   SURGEON: Levora DredgeGregory Anaisha Mago, M.D.  INDICATIONS: The patient is in the Emergency Room. He is hypotensive and requiring multiple medications and fluids to sustain his life. I am, therefore, asked to place a central line. Risks and benefits are reviewed. All questions answered. The patient has agreed to proceed.   DESCRIPTION OF PROCEDURE: The patient is positioned supine. The left groin is prepped and draped in a sterile fashion. The ultrasound is placed in a sterile sleeve. Femoral vein is identified. It is echolucent and compressible, indicating patency. Image is recorded. Then 1% lidocaine is infiltrated in the soft tissues, and a micropuncture needle is inserted into the femoral vein, microwire followed by micro-sheath, J-wire, followed by a dilator and then the triple-lumen catheter. All 3 lumens aspirate and flush easily. The catheter is secured to the skin of the thigh with 2-0 silk and a sterile dressing, including a Biopatch, is placed. There are no complications.    ____________________________ Renford DillsGregory G. Edmund Holcomb, MD ggs:mr D: 10/26/2013 19:03:02 ET T: 10/26/2013 21:10:00 ET JOB#: [id number]  cc: Renford DillsGregory G. Rogina Schiano, MD, <Dictator> Renford DillsGREGORY G Mollie Rossano MD ELECTRONICALLY SIGNED 11/06/2013 17:17

## 2015-01-16 NOTE — Op Note (Signed)
PATIENT NAME:  [name], KATZENSTEIN MR#:  [id number] DATE OF BIRTH:  1935/05/02  DATE OF PROCEDURE:  11/03/2013  PREOPERATIVE DIAGNOSES: 1.  Peripheral arterial disease with gangrene, left lower extremity.  2.  Chronic hypotension.  3.  End-stage renal disease.   POSTOPERATIVE DIAGNOSES:  1.  Peripheral arterial disease with gangrene, left lower extremity.  2.  Chronic hypotension.  3.  End-stage renal disease.   PROCEDURES: 1.  Ultrasound guidance for vascular access, right femoral artery. 2.  Catheter placement into left posterior tibial artery from right femoral approach.  3.  Aortogram and selective left lower extremity angiogram.  4.  Percutaneous transluminal angioplasty of distal posterior tibial artery just above the ankle with 3 mm diameter angioplasty balloon.  5.  Percutaneous transluminal angioplasty of tibioperoneal trunk with 3 mm diameter angioplasty balloon.  6.  StarClose closure device, right femoral artery.   SURGEON: Annice Needy, M.D.   ANESTHESIA: Local with moderate conscious sedation.   ESTIMATED BLOOD LOSS: 25 mL.  FLUOROSCOPY TIME: 6 minutes.   CONTRAST USED: 60 mL.  INDICATION FOR PROCEDURE: A 80 year old African American male with end-stage renal disease who has been in and out of the hospital in the critical care unit with hypotension for several weeks. He has a gangrenous left great toe. Angiogram was performed to try to maximize his perfusion. Risks and benefits were discussed. Informed consent was obtained.   DESCRIPTION OF PROCEDURE: The patient is brought to the vascular and interventional radiology suite. Groins were shaved and prepped and a sterile surgical field was created. Due to difficult body habitus, ultrasound was used to visualize a patent right femoral artery. It was accessed under direct ultrasound guidance without difficulty with a Seldinger needle. J-wire and 5-French sheath were then placed. Pigtail catheter was then placed into the aorta at  the L1-L2 level and AP aortogram was performed. This showed some flow in the renal arteries, although this was not brisk. The aorta and iliac segments were highly calcified but not stenotic. I then crossed the aortic bifurcation using a pigtail catheter and a J-wire and parked the catheter at the left femoral head. Selective left lower extremity angiogram was then performed. This demonstrated normal common femoral artery, profunda femoris artery, and superficial femoral artery. The popliteal artery was patent with a couple areas of 20% or less stenosis. He then had significant tibial disease and I had to put a catheter down into his superficial femoral artery to opacify the tibials as his circulation time was very slow. The anterior tibial artery was occluded and did not reconstitute distally. The peroneal artery was occluded and did not reconstitute distally. The posterior tibial artery was the sole runoff to the foot, and the tibioperoneal trunk had a moderate degree of stenosis. The posterior tibial artery distally had a high-grade stenosis just above the ankle and then had multiple collaterals in the foot. The patient was then heparinized. A 6-French Ansell sheath was placed over a [employer] wire and I was able to cross the tibioperoneal trunk stenosis and the distal posterior tibial stenosis with a 135 angled catheter and an 0.018 wire. We confirmed intraluminal flow at the level of the foot and performed percutaneous transluminal angioplasty of distal posterior tibial artery with a nominal inflation with a 3 mm diameter angioplasty balloon. A waste was taken which resolved with angioplasty. The tibioperoneal trunk lesion was treated with a higher inflation, near burst pressure, with a 3 mm diameter angioplasty balloon. Following angioplasty the catheter  was placed back in the popliteal artery and completion angiogram was then performed. This demonstrated marked improvement in the flow in both locations  with various treatment with more brisk flow into the foot and no greater than 20% residual stenosis in the 2 areas treated. At this point, I elected to terminate the procedure. The sheath was removed. StarClose closure device was deployed in the usual fashion with excellent hemostatic result. The patient tolerated the procedure well and was taken to the recovery room in stable condition.   ____________________________ Annice NeedyJason S. Dew, MD jsd:sb D: 11/03/2013 12:23:32 ET T: 11/03/2013 14:48:16 ET JOB#: [id number]  cc: Annice NeedyJason S. Dew, MD, <Dictator> Annice NeedyJASON S DEW MD ELECTRONICALLY SIGNED 11/12/2013 10:53

## 2015-01-16 NOTE — H&P (Signed)
PATIENT NAME:  [name], Antario H MR#:  [id number] DATE OF BIRTH:  June 24, 1935  DATE OF ADMISSION:  12/18/2013  HISTORY OF PRESENT ILLNESS: This is a 80 year old male with some chronic dry gangrenous changes on the toes of his left forefoot. This was discovered at a recent hospitalization last month. This has been stable on an outpatient basis, but decision was made with the family to go ahead and perform a transmetatarsal amputation of the left foot. The patient is being admitted for procedure of the same, admitted for medical management and stabilization with preoperative clearance for planned procedure tomorrow, 03/27.   PAST MEDICAL HISTORY: 1.  Diabetes mellitus. Last hemoglobin A1c 5.7 in 10/2013. 2.  Hypertension.  3.  Congestive heart failure, ejection fraction 45%. 4.  Coronary artery disease. 5.  Left frontal lobe meningioma, stable. 6.  Peripheral vascular disease with gangrenous changes, left foot. 7.  Chronic anemia. 8.  End-stage renal disease on dialysis Monday, Wednesday, and Friday. 9.  Hyperlipidemia. 10.  Gout. 11.  Hypothyroidism.   PAST SURGICAL HISTORY:  1.  Multiple AV fistula surgery. 2.  PermCath placement.  3.  Pacemaker placement. 4.  Angioplasty, left leg.   MEDICATIONS: 1.  Aspirin 81 mg daily. 2.  Plavix 75 mg daily. 3.  Dialyte vitamin daily. 4.  Levothyroxine 100 mcg daily. 5.  Lipitor 10 mg daily. 6.  Midodrine 10 mg 3 times daily. 7.  Cinacalcet 60 mg q. 12 hours. 8.  Allopurinol 100 mg at bedtime.  9.  Ranitidine 150 mg b.i.d.  10.  Quetiapine 12.5 mg at bedtime. 11.  Mirtazapine 30 mg at bedtime. 12.  Sevelamer 800 mg 3 tabs 3 times daily. 13.  Tylenol 650 mg q. 4 hours p.r.n.   ALLERGIES: METOPROLOL, OXYCODONE, ACE INHIBITORS.   FAMILY HISTORY: Heart disease, diabetes.   SOCIAL HISTORY: Lives at [facility]. No current alcohol or tobacco use.   REVIEW OF SYSTEMS: The patient relates pain in his left foot. Denies any specific fever or  chills. He is legally blind. Kidney failure, currently on dialysis.   PHYSICAL EXAMINATION: VASCULAR: DP and PT pulses are not clearly palpable. Gangrenous changes to the tips of all toes on the left foot.  NEUROLOGIC: There is some loss of protective threshold in the right forefoot. Again, the left is gangrenous with absent feeling distally.  INTEGUMENT: The skin is atrophic bilateral with some hyperpigmentations, especially in the left midfoot and distal. Dry gangrenous changes are noted to toes 1 through 5 on the left. No significant drainage at this point.  MUSCULOSKELETAL: Range of motion and muscle testing deferred.   ASSESSMENT: 1.  Peripheral vascular disease with gangrenous changes, left forefoot.  2.  Diabetes with some degree of neuropathy.   PLAN: We will admit the patient for amputation of the left forefoot to be performed tomorrow. Internal medicine consult for daily medical management and preoperative clearance. Place the patient on Zosyn 3.375 mg q. 6 hours, dosed adjusted by pharmacy. Plan for surgery at this point tomorrow for transmetatarsal amputation of the left forefoot pending clearance.  ____________________________ Linus Galasodd Sindee Stucker, DPM tc:sb D: 12/18/2013 13:31:27 ET T: 12/18/2013 14:01:27 ET JOB#: [id number]  cc: Linus Galasodd Yohance Hathorne, DPM, <Dictator> Laurian Edrington DPM ELECTRONICALLY SIGNED 01/13/2014 11:33

## 2015-01-16 NOTE — Consult Note (Signed)
PATIENT NAME:  [name], [name] MR#:  [id number] DATE OF BIRTH:  Dec 14, 1934  DATE OF CONSULTATION:  11/07/2013.  REQUESTING PHYSICIAN:  Max Sane, M.D.  CONSULTING PHYSICIAN: A. Lavone Orn, M.D.   CHIEF COMPLAINT:  Recurrent hypoglycemia.   HISTORY OF PRESENT ILLNESS:  This is a 80 year old male with a medical history to include end-stage renal disease on hemodialysis, congestive heart failure, dementia, diabetes, meningioma, and coronary disease, who was admitted on February 1 with chest pain. He has had a prolonged hospitalization and throughout the hospitalization, he has had occasional episodes of low fingerstick blood sugars. Of note, his blood sugar dropped February 6 around 7:30 a.m. in the 60s, on February 9 around 8:00 a.m. in the 50s and around 50 to 60 at 6:30 p.m. on February 11, in the 20 to 40 range around 1:50 p.m. February 12. For the last 48 hours, blood sugars have been fairly normal and in the range of 68 to 92. According to records, he has a history of diabetes. I do not see that he has received any recent hypoglycemia-causing medications including sulfonylureas or insulin. According to the H ` P, his home regimen includes insulin 70/30 mix 8 units b.i.d. The patient is a poor historian. He really was not able to answer any of my questions in an understandable manner. His venous blood sugars have never been low. We have had a few venous blood sugars in the 70 to 130 range since hospitalization. Also in reviewing his records, I see he was hospitalized in February 2014 and May 2014 and October 2014 and during all those hospitalizations, had low blood sugars on finger sticks but never on venous testing. It is not clear to me if he has ever had symptomatic hypoglycemia. During this hospitalization lows have been treated with IV dextrose with subsequent normalization of blood sugars. We have not checked a recent hemoglobin A1c. He is obese. I am not clear on his nutritional status or whether  he has been eating much. There is a meal tray at the bedside which he had not yet touched today.   PAST MEDICAL HISTORY:  1.  Diabetes mellitus.  2.  End-stage renal disease on hemodialysis.  3.  Congestive heart failure.  4.  Coronary artery disease.  5.  Sick sinus syndrome status post pacemaker placement.  6.  Peripheral vascular disease.  7.  Hypothyroidism.  8.  Hyperlipidemia.  9.  Secondary hyperparathyroidism.  10.  Gout.  PAST SURGICAL HISTORY:  1.  Multiple AV fistula placements.  2.  Perm-A-Cath placement.  3.  Pacemaker placement.   CURRENT INPATIENT MEDICATIONS:  1.  Levothyroxine 100 mcg daily. 2.  Allopurinol 100 mg daily.  3.  Augmentin 500 mg q.24 hours.  4.  Atorvastatin 10 mg at bedtime.  5.  Cinacalcet 60 mg q.12 hours.  6.  Senna/Docusate 8.6/50 mg 1 tab q.12 hours.  7.  Multivitamin 1 tab daily.  8.  Midodrine 10 mg t.i.d.  9.  Pantoprazole 40 mg daily.  10.  MiraLAX 17 mg q.p.m.  11.  Aspirin 81 mg daily.  12.  Renvela 800 mg t.i.d. with meals.  13.  Quetiapine 12.5 mg at bedtime.  14.  Plavix 75 mg daily.   ALLERGIES:  1.  ACE INHIBITORS.  2.  METOPROLOL.  3.  OXYCODONE.   FAMILY HISTORY:  Heart disease and diabetes.   REVIEW OF SYSTEMS:  Unable to obtain.   PHYSICAL EXAMINATION:  VITAL SIGNS:  Height 65 inches, weight  224 pounds, BMI 37, temperature 97.6, pulse 87, respirations 20, blood pressure 98/64, pulse ox 98% room air.  GENERAL:  Obese African American male.  HEENT:  Dentition is poor. Mucous membranes are dry. GENERAL:  Drowsy but arousable.  NECK:  Supple. No thyromegaly.  CARDIAC:  Regular rate and rhythm. Systolic murmur is present.  PULMONARY:  Clear bilaterally. No wheeze.  ABDOMEN:  Diffusely soft, nontender, nondistended.  EXTREMITIES:  No peripheral edema is present.  SKIN:  No dermatopathy is present. There is hyperpigmentation of the lower extremities. He has darkening of the left great toe. No acute rash is present.   PSYCHIATRIC:  Cooperative.  NEUROLOGIC:  Alert to person, did not respond when asked questions about time or place.   LABORATORY DATA:  On February 11 the following labs were drawn:  BUN 26, creatinine 6.59, sodium 139, potassium 3.9, eGFR 9, phosphorus 2.6, hemoglobin 9.7.   ASSESSMENT:  A 80 year old male with a reported history of diabetes mellitus now with recurrent hypoglycemia on fingerstick blood sugar testing. End-stage renal disease and peripheral vascular disease often limit reliability of fingerstick blood sugar testing. We need to determine if perceived hypoglycemia is real.   RECOMMENDATIONS:  1.  Will discontinue his IV dextrose.  2.  Once the fingerstick blood sugar is less than 60, the following labs should be obtained:  Glucose, C-peptide, insulin, cortisol, beta hydroxybutyrate. 3.  If the patient has hypoglycemia with symptoms, certainly could reinstate the continuous IV dextrose. However, I am concerned that symptom detection will be difficult as I suspect his baseline dementia makes assessment of his symptoms and general mental status challenging. In this case, I plan to just give an amp of D10 in addition to restarting D10 at 75 mL/hour should after labs are obained for a low (<60) fingerstick blood sugar. If the venous blood sugar does not show that the blood sugar is low, therefore it does not correspond with fingerstick blood sugar testing, then we should stop the fingerstick blood sugars as again they seem to be fairly unreliable. We will also obtain a hemoglobin A1c to assess general glycemic control. However, it is known that A1c tests are not always reliable in end-stage renal disease patients, also in light of his recent injection of Epogen, the A1c might not be the best test and may be falsely low. Nevertheless, I do plan to obtain an A1c for what is worth.   Thanks for the kind request for consultation. I will not be available to see the patient over the weekend,  however, I will return on Monday, February 16, if there are further questions or concerns.   ____________________________ A. Lavone Orn, MD ams:jm D: 11/07/2013 16:26:23 ET T: 11/07/2013 16:48:06 ET JOB#: [id number]  cc: A. Lavone Orn, MD, <Dictator> Sherlon Handing MD ELECTRONICALLY SIGNED 11/12/2013 21:16

## 2015-01-16 NOTE — Discharge Summary (Signed)
PATIENT NAME:  [name], [name] MR#:  [id number] DATE OF BIRTH:  18-Sep-1935  DATE OF ADMISSION:  10/26/2013 DATE OF DISCHARGE:  11/11/2013  ADMITTING PHYSICIAN: Dr. Imogene Burn   DISCHARGING PHYSICIAN: Enid Baas, MD  PRIMARY CARE PHYSICIAN: At [facility].   CONSULTATIONS IN THE HOSPITAL:  1. Nephrology consultation by Dr. Mady Haagensen and Dr. Thedore Mins.  2. Podiatric consultation by Dr. Linus Galas.  3. Vascular consultation by Dr. Festus Barren.  4. Endocrinology consultation with Dr. Tedd Sias. 5. ID consultation by Dr. Sampson Goon.   DISCHARGE DIAGNOSES: 1. Acute respiratory failure.  2. Pneumonia.  3. Sepsis.  4. Streptococcus ferritins bacteremia.  5. Left foot fourth and fifth toes dry gangrene.  6. Acute on chronic anemia requiring 2 units packed RBC transfusion this admission.  7. Recurrent hypoglycemic episodes secondary to poor p.o. intake.  8. Chronic systolic congestive heart failure with ejection fraction of 45%.  9. Coronary artery disease status post recent Non ST-elevation myocardial infarction.  10.  Left frontal lobe meningioma, which is stable.  11. Dementia.  12. Sleep apnea.  13. The patient is legally blind.  14. Peripheral vascular disease.   DISCHARGE MEDICATIONS: 1. Aspirin 81 mg p.o. daily.  2. Plavix 75 mg p.o. daily.  3. Dialyvite  800 with vitamin B complex and C and folic acid 1 tablet p.o. daily.  4. Levothyroxine 0.1 mg, 1 daily.  5. Lipitor 10 mg p.o. daily.  6. Tylenol 650 mg q.4 hours p.r.n. for pain or fever.  7. Midodrine 10 mg p.o. 3 times a day. 8. Cinacalcet  60 mg p.o. b.i.d.  9. Allopurinol 100 mg p.o. daily.  10. Ranitidine 150 mg p.o. b.i.d.  11. Quetiapine 12.5 mg p.o. daily.  12. Sevelamer 800 mg 3 tablets 3 times a day with meals.  13. Mirtazapine 30 mg p.o. at bedtime.  14. Augmentin 500 mg p.o. daily for five more days.   DISCHARGE DIET: Renal diet, regular consistency and also will need Nepro supplement drink 3 to 4 times a day with meals,  strawberry flavor per patient preference.  HOME OXYGEN: 3 liters.   DISCHARGE ACTIVITY: As tolerated.    FOLLOWUP INSTRUCTIONS: 1. PCP  followup in 2 to 3 days.  2. Vascular follow-up in 2 weeks.  3. Dialysis per schedule on Mondays, Wednesdays, and Fridays.  4. Dialysis for 11/12/2013.  5. The patient needs assistance with feeding as he is legally blind.  6. The patient has poor circulation, so low fingerstick sugar or low peripheral sats need to be confirmed  by a serum draw.   LABS AND IMAGING STUDIES PRIOR TO DISCHARGE: Sodium 137, potassium 4.3, chloride 99, bicarbonate 37, BUN 42, creatinine 8.07, glucose of 103 and calcium of 8.9. Hemoglobin is 8.4. Chest x-ray on 11/08/2013 showing persistent left lobe airspace disease concerning for pneumonia, small left parapneumonic effusion, cardiomegaly with pulmonary vascular congestion noted.  HbA1c is 5.7.   Blood culture on 10/26/2013 on admission showing Staphylococcus capitis and Streptococcus viridans. A second set of blood culture was negative.   BRIEF HOSPITAL COURSE: Please also look at the history and physical dictated by Dr. Imogene Burn on 10/26/2013 and then interim summary dictated by Dr. Luberta Mutter on 10/30/2013. [name] is a 80 year old African American male with multiple medical problems including peripheral vascular disease with left foot lateral toes gangrene,  end-stage renal disease on hemodialysis, hypertension, history of diabetes with hypoglycemic episodes, anemia, chronic hypotension, presents secondary to chest pain and was noted to be anemic significantly and also  septic with hypotension. 1. Sepsis secondary to pneumonia and also left foot toe gangrene. Blood cultures, one set was growing staph capitis and strep viridans, one of which could be contaminant. Second blood culture was negative, but because he presented with sepsis picture he was started on broad-spectrum antibiotics. He was seen by ID physician. With improving white  count and being afebrile his antibiotics have been narrowed down to Augmentin and he will finish off in five more days. Was seen by podiatry and vascular for his left foot 4th and 5th toe gangrene. They recommended outpatient follow-up to assess need for any amputation. X-ray did not show any evidence of acute osteomyelitis. The patient also already had angiogram and they recommended only aspirin and Plavix and statin for peripheral vascular disease.                    Regarding his pneumonia his breathing has improved and he had an episode of acute respiratory distress with hypoxia, though he was clinically about the same. X-ray showed vascular congestion and left lower lobe infiltrate. Probably what might have happened was because of his poor circulation, the peripheral fingers sats measuring equipment was not able to measure his sats accurately. Now he has a probe to the earlobe for measuring sats. He has been on 2 to 3 liters of oxygen here and will be continued on the same.  2. Chronic orthostatic hypotension. He is on midodrine, especially blood pressure falls after dialysis. He will continue to monitor. He did get 1 or 2 fluid boluses as needed during the hospital course.  3. Acute on chronic anemia, he did require 2 units of packed RBC transfusion.  4. Frequent hypoglycemia while in the hospital. Again, due to poor peripheral circulation. His fingersticks were not reliable, showing the right glucose.  He was also seen by an endocrinologist Dr. Tedd SiasSolum and was on D5 at some point. Later realized that all his serum draws were normal, and it was only the peripheral fingersticks showing low sugars. So Dr. Tedd SiasSolum recommended if his blood sugar was to drop less than 50 it needs to be confirmed using  a serum draw if the patient is clinically not changed.  5. Poor p.o. intake and adult failure to thrive. Maybe with worsening dementia and overall clinical decline. The patient p.o. intake has been consistently  declining. He was started on Remeron just prior to discharge. If needed in the future for nutritional needs his wife is okay with PEG tube and doing everything for the patient. He is not eating so much of solid food but is likely his Nepro and protein shakes, which can be continued over to at the rehab as well. The patient is legally blind and he needs constant assistance with feeding.  6. His course has been otherwise uneventful in the hospital.   DISCHARGE CONDITION: Guarded with long-term poor prognosis.   DISCHARGE DISPOSITION: To rehab facility as recommended by physical therapy. The patient is baseline wheelchair-bound. The patient will be going to [facility].   TIME SPENT ON DISCHARGE: 45 minutes.   ____________________________ Enid Baasadhika Rebecca Cairns, MD rk:sg D: 11/11/2013 09:53:00 ET T: 11/11/2013 10:29:31 ET JOB#: [id number]  cc: Enid Baasadhika Kalie Cabral, MD, <Dictator> Enid BaasADHIKA Dartanian Knaggs MD ELECTRONICALLY SIGNED 11/11/2013 15:11

## 2015-07-03 IMAGING — XA IR VASCULAR PROCEDURE
1 series · 1 of 1 positions shown · IV contrast (IODINE)
Comparison: none

[Series 2: care upper arm · 1 of 1 slices shown]
[im 1/1]
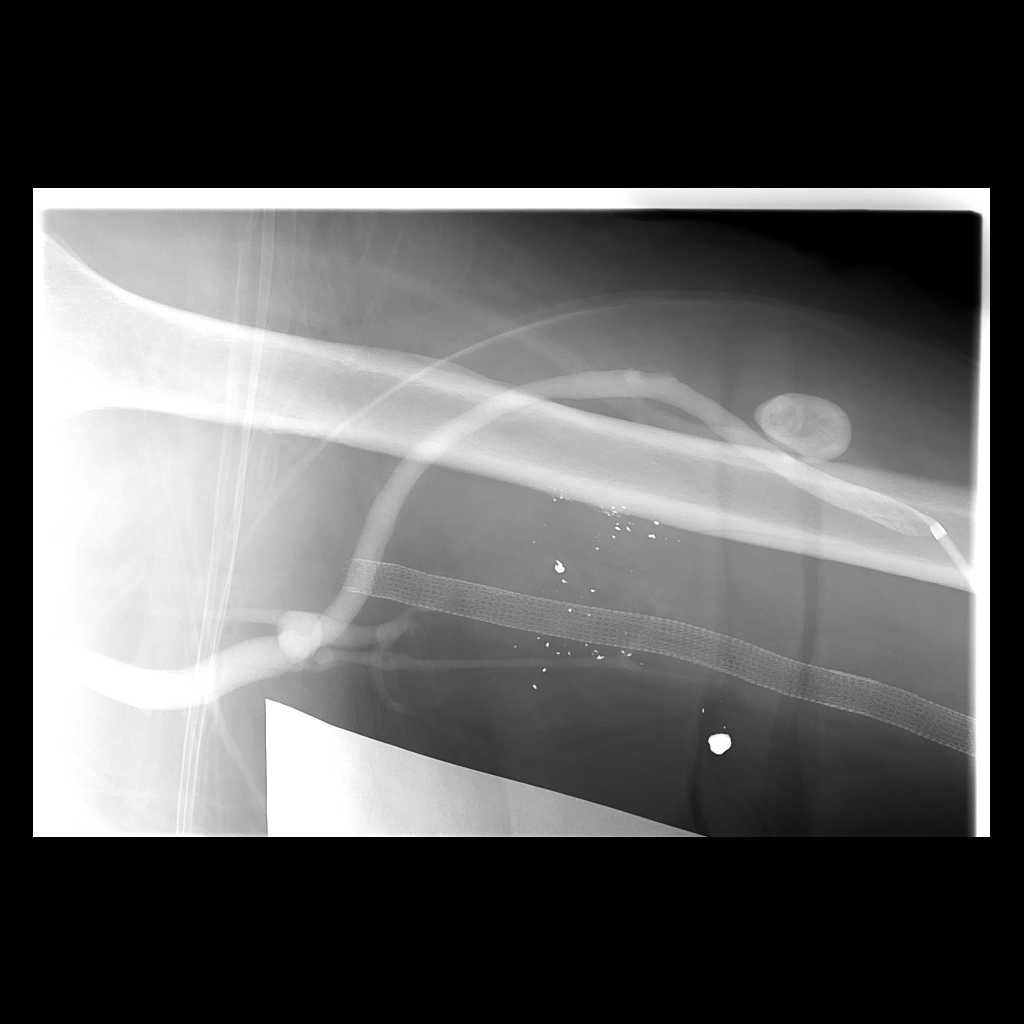

[1 of 1 positions shown; findings below may reference images not displayed]

IMAGES IMPORTED FROM THE [employer] WORKFLOW SYSTEM
NO DICTATION FOR STUDY

## 2015-07-09 IMAGING — CT CT HEAD WITHOUT CONTRAST
1 series · 15 of 30 positions shown, 19 images · non-contrast
Comparison: 09/03/2013.  MRI 06/18/2013.

CLINICAL DATA: Altered mental status.  Confusion.

EXAM:
CT HEAD WITHOUT CONTRAST
TECHNIQUE: Contiguous axial images were obtained from the base of the skull
through the vertex without intravenous contrast.

[Series 2: head wo · axial · 0.45mm/px · z∈[-152,+1]mm · 15 of 38 slices shown, 19 images]
[im 2/38  brain]
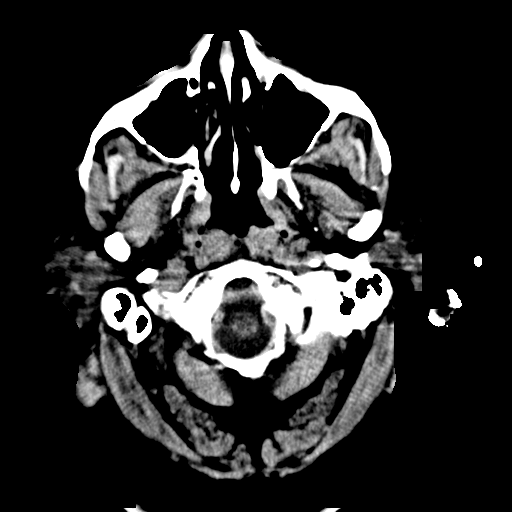
[im 2/38  bone]
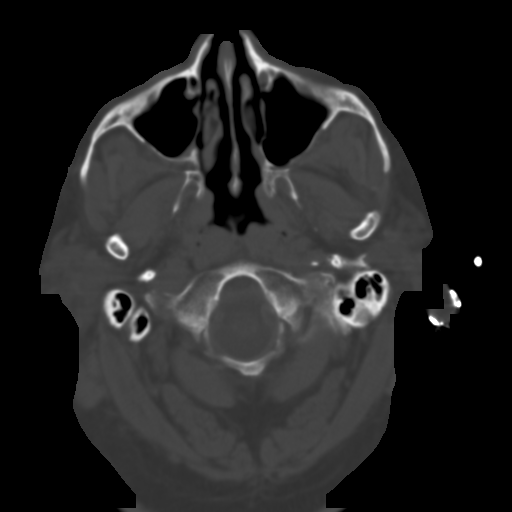
[im 4/38  brain]
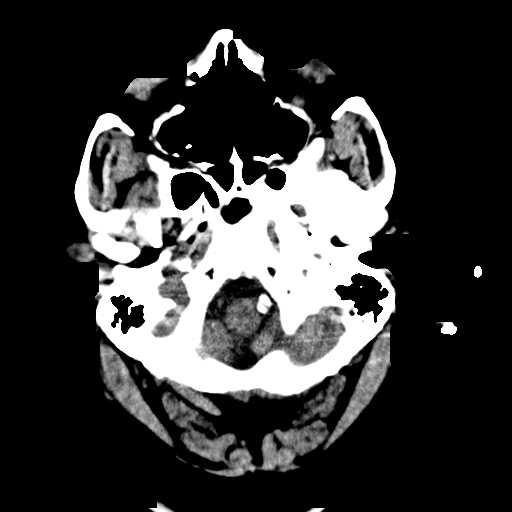
[im 7/38  brain]
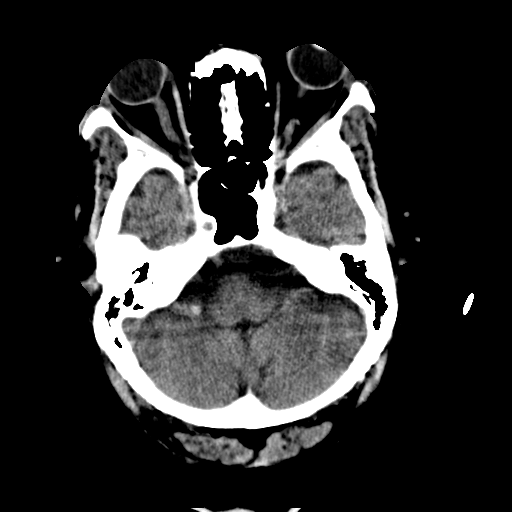
[im 9/38  brain]
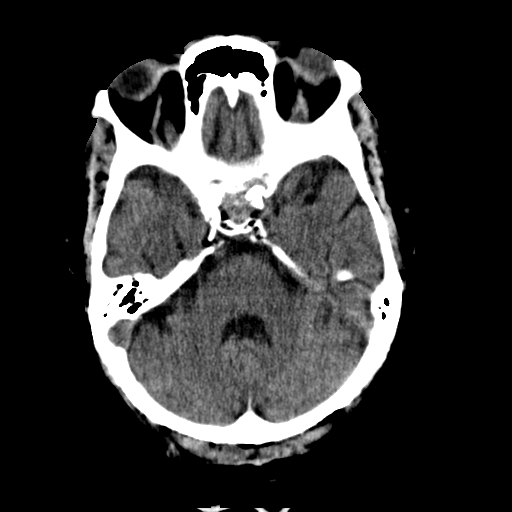
[im 12/38  brain]
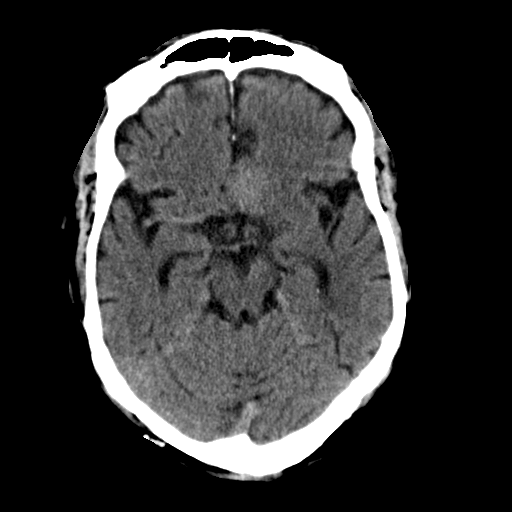
[im 12/38  bone]
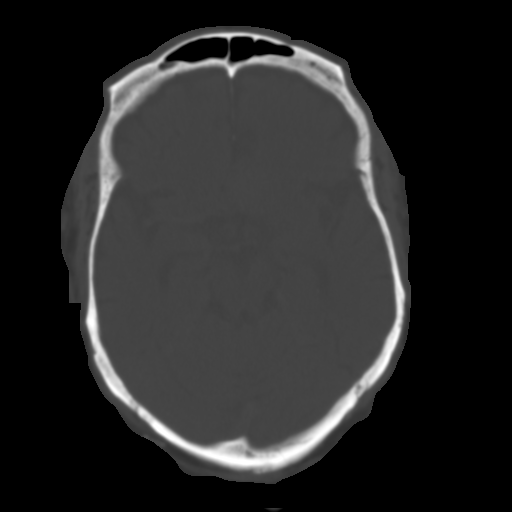
[im 15/38  brain]
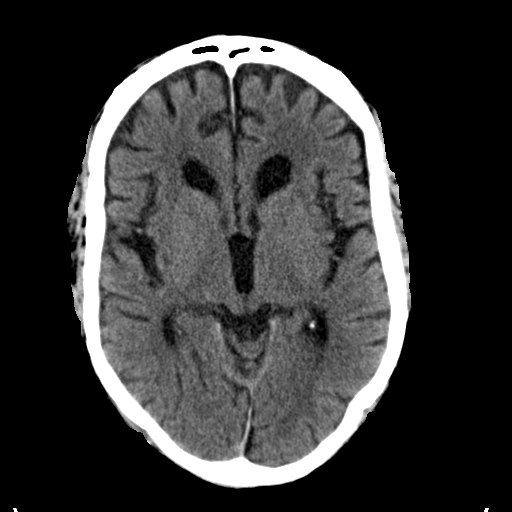
[im 17/38  brain]
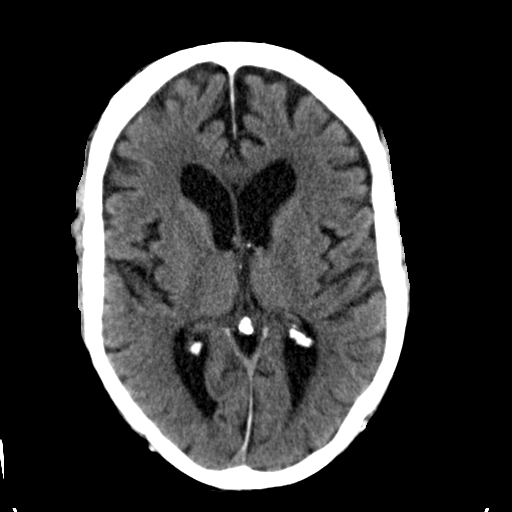
[im 20/38  brain]
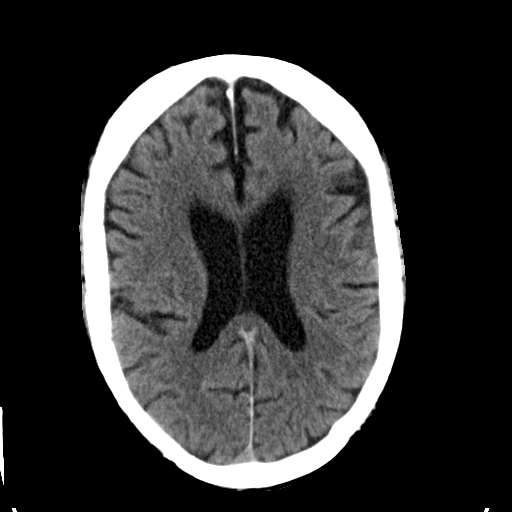
[im 21/38  brain]
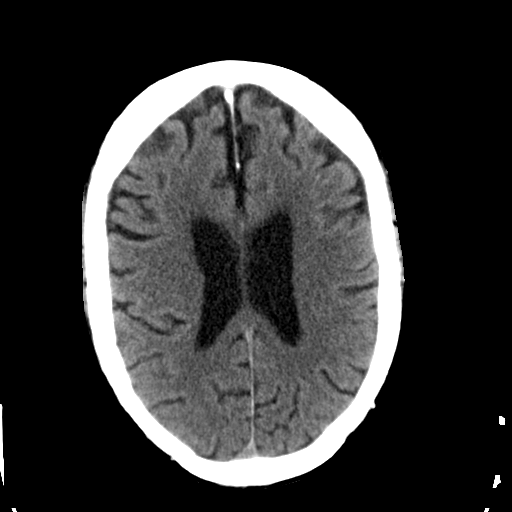
[im 21/38  bone]
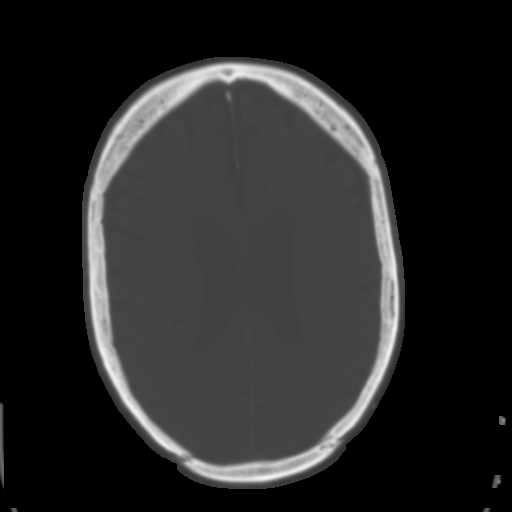
[im 23/38  brain]
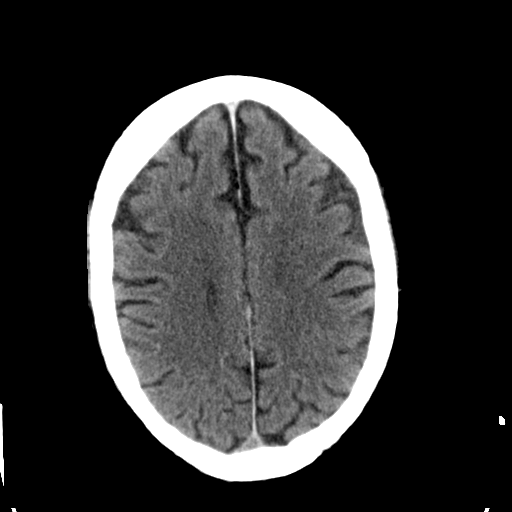
[im 26/38  brain]
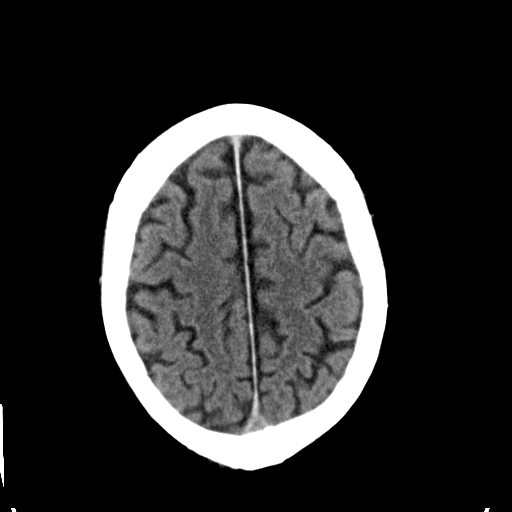
[im 29/38  brain]
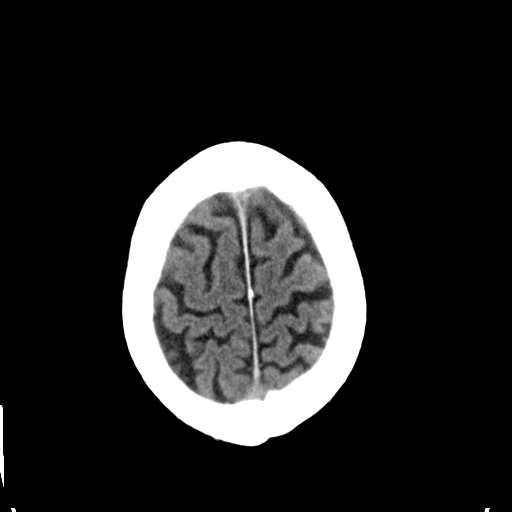
[im 31/38  brain]
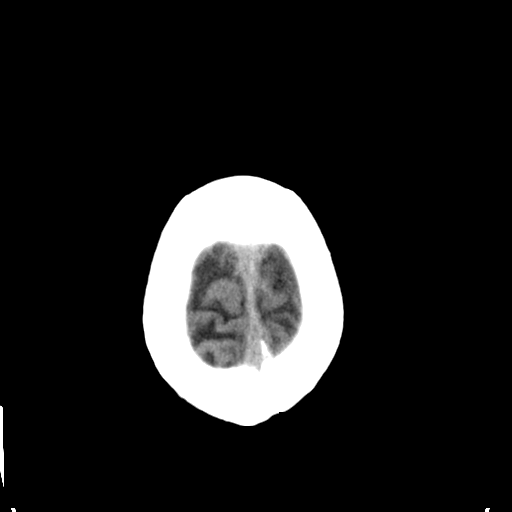
[im 31/38  bone]
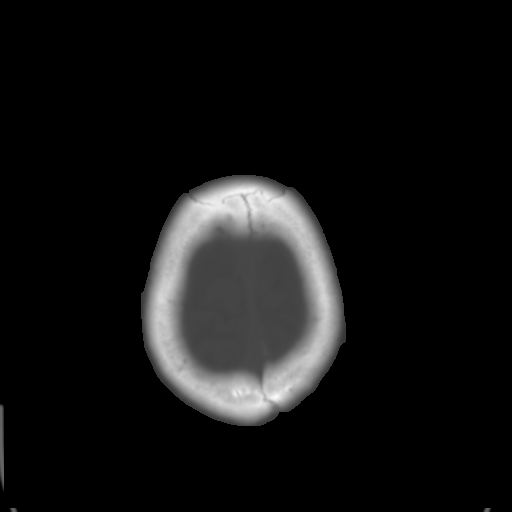
[im 34/38  brain]
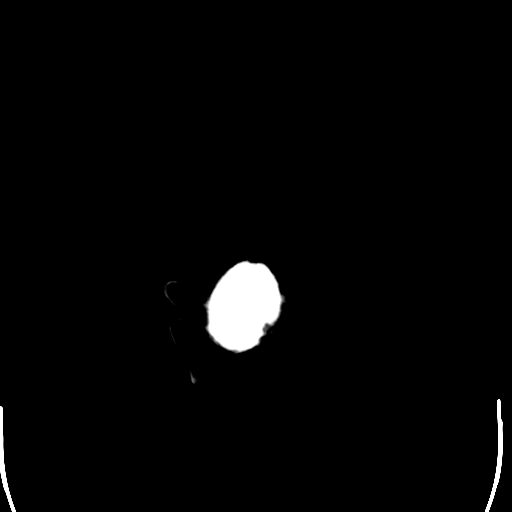
[im 36/38  brain]
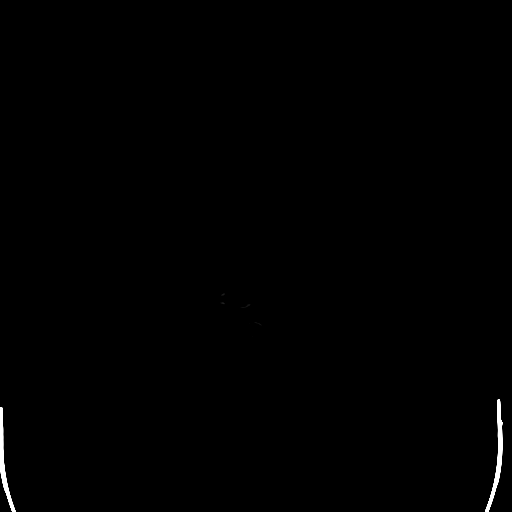

[15 of 30 positions shown; findings below may reference images not displayed]

FINDINGS: The brain again shows mild generalized atrophy. There is no sign of
acute infarction. No evidence of intracranial hemorrhage,
hydrocephalus or extra-axial fluid collection.

2 x 2.3 cm extra-axial mass arising from the posterior planum to the
left of midline, indenting the inferior frontal lobe on the left is
again seen and is consistent with meningioma. The previous
examination was essentially a postcontrast study as it was done
after a vascular procedure and there was intravascular contrast
persisting. The enhancement pattern is further evidence that this
represents a meningioma. There is no vasogenic edema or significant
mass effect. I would not expect this to be of acute clinical
relevance given the provided history.

No fluid in the sinuses, middle ears or mastoids.
IMPRESSION: No acute finding.  No evidence of acute stroke or hemorrhage.

2 x 2.3 cm extra-axial mass indenting the inferior left frontal lobe
consistent with meningioma. See above discussion.

## 2015-09-08 IMAGING — CR DG CHEST 1V PORT
1 series · 1 of 1 positions shown · non-contrast
Comparison: Chest x-ray 05/21/2014.

CLINICAL DATA: Low oxygen saturations.  Hypotension.

EXAM:
PORTABLE CHEST - 1 VIEW

[ap]
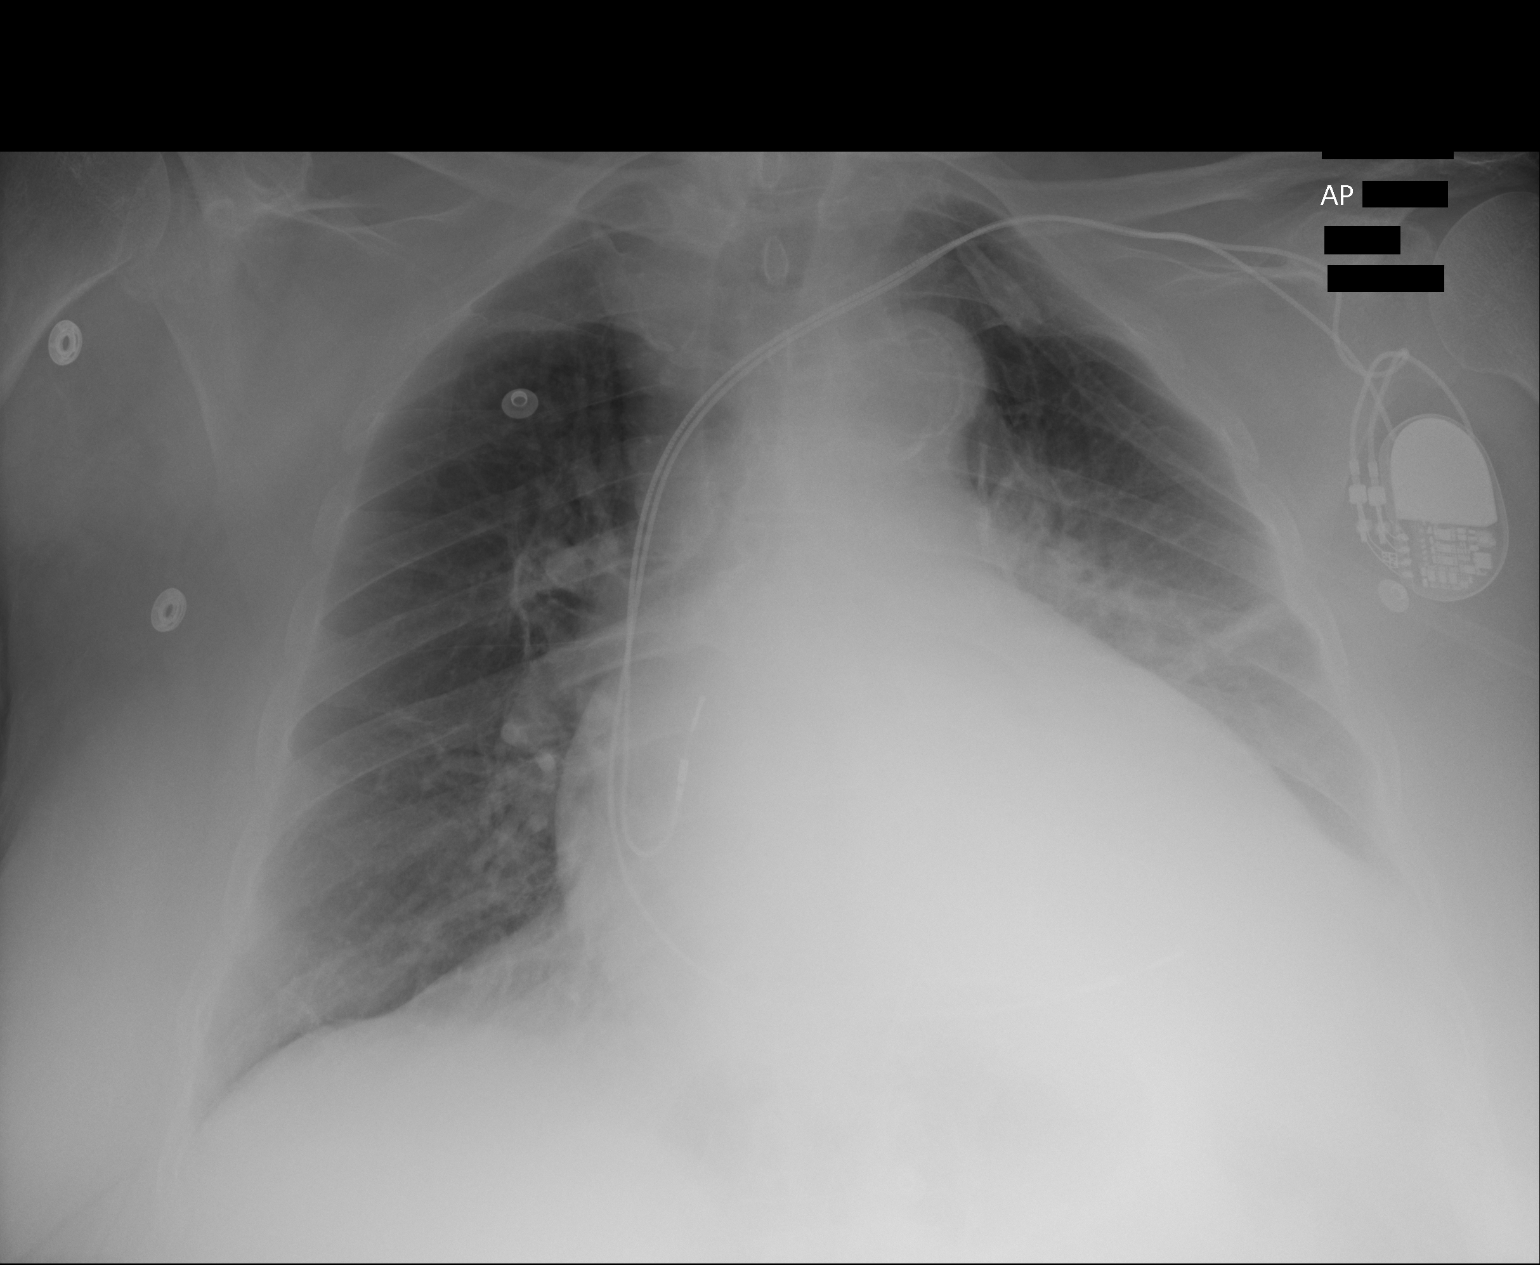

[1 of 1 positions shown; findings below may reference images not displayed]

FINDINGS: Persistent airspace consolidation in the left lower lobe. Small left
pleural effusion. Right lung appears clear. Cephalization of the
pulmonary vasculature, without frank pulmonary edema. Mild
cardiomegaly is unchanged. Upper mediastinal contours are distorted
by patient's rotation to the left. Atherosclerosis in the thoracic
aorta. Previously noted right internal jugular central venous
catheter has been removed. Left-sided pacemaker device in position
with lead tips projecting over the expected location of the right
atrium and right ventricular apex.
IMPRESSION: 1. Persistent left lower lobe airspace consolidation concerning for
pneumonia. Small left parapneumonic pleural effusion.
2. Cardiomegaly with pulmonary venous congestion.
3. Atherosclerosis.
